# Patient Record
Sex: Male | Born: 1965
Health system: Southern US, Community
[De-identification: ages and names within clinical notes are randomized; demographics above are authoritative.]

## PROBLEM LIST (undated history)

## (undated) DIAGNOSIS — G4733 Obstructive sleep apnea (adult) (pediatric): Secondary | ICD-10-CM

## (undated) DIAGNOSIS — I10 Essential (primary) hypertension: Secondary | ICD-10-CM

## (undated) DIAGNOSIS — A071 Giardiasis [lambliasis]: Secondary | ICD-10-CM

## (undated) DIAGNOSIS — R197 Diarrhea, unspecified: Secondary | ICD-10-CM

## (undated) DIAGNOSIS — E785 Hyperlipidemia, unspecified: Secondary | ICD-10-CM

## (undated) HISTORY — DX: Essential (primary) hypertension: I10

## (undated) HISTORY — DX: Hyperlipidemia, unspecified: E78.5

## (undated) HISTORY — DX: Obstructive sleep apnea (adult) (pediatric): G47.33

## (undated) HISTORY — DX: Diarrhea, unspecified: R19.7

## (undated) HISTORY — DX: Giardiasis (lambliasis): A07.1

---

## 1996-04-07 HISTORY — PX: OTHER SURGICAL HISTORY: SHX169

## 1998-08-06 ENCOUNTER — Encounter: Admission: RE | Admit: 1998-08-06 | Discharge: 1998-08-06 | Payer: Self-pay | Admitting: Internal Medicine

## 1998-08-08 ENCOUNTER — Encounter: Admission: RE | Admit: 1998-08-08 | Discharge: 1998-08-08 | Payer: Self-pay | Admitting: Internal Medicine

## 1999-08-30 ENCOUNTER — Encounter: Admission: RE | Admit: 1999-08-30 | Discharge: 1999-08-30 | Payer: Self-pay | Admitting: Internal Medicine

## 1999-09-02 ENCOUNTER — Encounter: Admission: RE | Admit: 1999-09-02 | Discharge: 1999-09-02 | Payer: Self-pay | Admitting: Hematology and Oncology

## 1999-09-25 ENCOUNTER — Encounter: Admission: RE | Admit: 1999-09-25 | Discharge: 1999-09-25 | Payer: Self-pay | Admitting: Internal Medicine

## 1999-10-04 ENCOUNTER — Emergency Department (HOSPITAL_COMMUNITY): Admission: EM | Admit: 1999-10-04 | Discharge: 1999-10-04 | Payer: Self-pay | Admitting: Emergency Medicine

## 2000-02-13 ENCOUNTER — Encounter: Admission: RE | Admit: 2000-02-13 | Discharge: 2000-02-13 | Payer: Self-pay | Admitting: Hematology and Oncology

## 2000-02-20 ENCOUNTER — Encounter: Admission: RE | Admit: 2000-02-20 | Discharge: 2000-05-20 | Payer: Self-pay | Admitting: Internal Medicine

## 2001-12-21 ENCOUNTER — Encounter: Admission: RE | Admit: 2001-12-21 | Discharge: 2001-12-21 | Payer: Self-pay | Admitting: Internal Medicine

## 2001-12-27 ENCOUNTER — Encounter: Admission: RE | Admit: 2001-12-27 | Discharge: 2001-12-27 | Payer: Self-pay | Admitting: Internal Medicine

## 2002-11-29 ENCOUNTER — Encounter: Admission: RE | Admit: 2002-11-29 | Discharge: 2002-11-29 | Payer: Self-pay | Admitting: Internal Medicine

## 2003-08-16 ENCOUNTER — Ambulatory Visit (HOSPITAL_COMMUNITY): Admission: RE | Admit: 2003-08-16 | Discharge: 2003-08-16 | Payer: Self-pay | Admitting: Orthopaedic Surgery

## 2004-02-27 ENCOUNTER — Ambulatory Visit: Payer: Self-pay | Admitting: Internal Medicine

## 2004-02-28 ENCOUNTER — Ambulatory Visit: Payer: Self-pay | Admitting: Internal Medicine

## 2005-04-16 ENCOUNTER — Ambulatory Visit (HOSPITAL_COMMUNITY): Admission: RE | Admit: 2005-04-16 | Discharge: 2005-04-16 | Payer: Self-pay | Admitting: Internal Medicine

## 2005-04-16 ENCOUNTER — Ambulatory Visit: Payer: Self-pay | Admitting: Internal Medicine

## 2005-11-05 ENCOUNTER — Ambulatory Visit: Payer: Self-pay | Admitting: Internal Medicine

## 2005-12-19 LAB — HM COLONOSCOPY: HM Colonoscopy: NEGATIVE

## 2005-12-25 ENCOUNTER — Encounter: Payer: Self-pay | Admitting: Internal Medicine

## 2006-02-05 ENCOUNTER — Ambulatory Visit: Admission: RE | Admit: 2006-02-05 | Discharge: 2006-02-05 | Payer: Self-pay | Admitting: Gastroenterology

## 2006-02-05 ENCOUNTER — Encounter: Payer: Self-pay | Admitting: Internal Medicine

## 2006-04-02 DIAGNOSIS — I10 Essential (primary) hypertension: Secondary | ICD-10-CM | POA: Insufficient documentation

## 2006-04-02 DIAGNOSIS — E785 Hyperlipidemia, unspecified: Secondary | ICD-10-CM | POA: Insufficient documentation

## 2006-04-02 DIAGNOSIS — A071 Giardiasis [lambliasis]: Secondary | ICD-10-CM | POA: Insufficient documentation

## 2006-04-02 DIAGNOSIS — R0681 Apnea, not elsewhere classified: Secondary | ICD-10-CM | POA: Insufficient documentation

## 2006-04-02 DIAGNOSIS — R197 Diarrhea, unspecified: Secondary | ICD-10-CM | POA: Insufficient documentation

## 2006-10-29 ENCOUNTER — Encounter (INDEPENDENT_AMBULATORY_CARE_PROVIDER_SITE_OTHER): Payer: Self-pay | Admitting: Internal Medicine

## 2006-10-29 ENCOUNTER — Ambulatory Visit: Payer: Self-pay | Admitting: Internal Medicine

## 2006-10-29 DIAGNOSIS — F331 Major depressive disorder, recurrent, moderate: Secondary | ICD-10-CM | POA: Insufficient documentation

## 2006-10-29 DIAGNOSIS — J45909 Unspecified asthma, uncomplicated: Secondary | ICD-10-CM | POA: Insufficient documentation

## 2006-10-29 DIAGNOSIS — E781 Pure hyperglyceridemia: Secondary | ICD-10-CM | POA: Insufficient documentation

## 2006-10-30 LAB — CONVERTED CEMR LAB
ALT: 30 units/L (ref 0–53)
BUN: 16 mg/dL (ref 6–23)
CO2: 26 meq/L (ref 19–32)
Calcium: 9.6 mg/dL (ref 8.4–10.5)
Chloride: 103 meq/L (ref 96–112)
Creatinine, Ser: 1 mg/dL (ref 0.40–1.50)
Glucose, Bld: 91 mg/dL (ref 70–99)
LDL Cholesterol: 104 mg/dL — ABNORMAL HIGH (ref 0–99)
Sodium: 141 meq/L (ref 135–145)
Total Bilirubin: 0.7 mg/dL (ref 0.3–1.2)

## 2006-12-09 ENCOUNTER — Ambulatory Visit: Payer: Self-pay | Admitting: Internal Medicine

## 2006-12-15 ENCOUNTER — Telehealth: Payer: Self-pay | Admitting: *Deleted

## 2007-04-19 ENCOUNTER — Telehealth: Payer: Self-pay | Admitting: *Deleted

## 2007-05-28 ENCOUNTER — Emergency Department (HOSPITAL_COMMUNITY): Admission: EM | Admit: 2007-05-28 | Discharge: 2007-05-28 | Payer: Self-pay | Admitting: Family Medicine

## 2008-08-22 ENCOUNTER — Ambulatory Visit: Payer: Self-pay | Admitting: Family Medicine

## 2008-10-04 ENCOUNTER — Ambulatory Visit: Payer: Self-pay | Admitting: Family Medicine

## 2008-10-04 DIAGNOSIS — R42 Dizziness and giddiness: Secondary | ICD-10-CM | POA: Insufficient documentation

## 2008-10-06 LAB — CONVERTED CEMR LAB
Calcium: 9.3 mg/dL (ref 8.4–10.5)
Eosinophils Absolute: 0.1 10*3/uL (ref 0.0–0.7)
Eosinophils Relative: 2.5 % (ref 0.0–5.0)
Glucose, Bld: 89 mg/dL (ref 70–99)
HCT: 44.1 % (ref 39.0–52.0)
Lymphocytes Relative: 28.2 % (ref 12.0–46.0)
Lymphs Abs: 1.6 10*3/uL (ref 0.7–4.0)
MCV: 94.4 fL (ref 78.0–100.0)
Monocytes Absolute: 0.6 10*3/uL (ref 0.1–1.0)
Neutro Abs: 3.5 10*3/uL (ref 1.4–7.7)
Potassium: 4 meq/L (ref 3.5–5.1)
RBC: 4.67 M/uL (ref 4.22–5.81)
Sodium: 140 meq/L (ref 135–145)

## 2008-12-01 ENCOUNTER — Ambulatory Visit: Payer: Self-pay | Admitting: Family Medicine

## 2009-04-20 ENCOUNTER — Ambulatory Visit: Payer: Self-pay | Admitting: Family Medicine

## 2009-04-20 DIAGNOSIS — M25519 Pain in unspecified shoulder: Secondary | ICD-10-CM | POA: Insufficient documentation

## 2009-09-14 ENCOUNTER — Ambulatory Visit: Payer: Self-pay | Admitting: Family Medicine

## 2009-09-14 DIAGNOSIS — H6690 Otitis media, unspecified, unspecified ear: Secondary | ICD-10-CM | POA: Insufficient documentation

## 2010-05-09 NOTE — Assessment & Plan Note (Signed)
Summary: ear pain/njr   Vital Signs:  Patient profile:   45 year old male Temp:     98.7 degrees F oral BP sitting:   110 / 84  (left arm) Cuff size:   regular  Vitals Entered By: Sid Falcon LPN (September 14, 2009 10:02 AM) CC: Right ear pain   History of Present Illness: Sharp R ear pain for 2 days.  No drainage. Multiple tubes in past.  Denies sore throat, nasal congestion, fever, chills. Mild tinnitus.  No major hearing changes.  No vertigo.  Allergies (verified): No Known Drug Allergies  Past History:  Past Medical History: Last updated: 04/02/2006 Asthma Hyperlipidemia Hypertension Obstructive apnea (sleep study 11-20-95, no apneas), ?obstructive hypopneas? Giardiasis, hx of, resolved Diarrhea Family hx of colon cancer, father died at age 54  Past Surgical History: Last updated: 04/02/2006 ENT surgery, 1998 PMH reviewed for relevance  Review of Systems      See HPI  Physical Exam  General:  Well-developed,well-nourished,in no acute distress; alert,appropriate and cooperative throughout examination Ears:  R serous effusion but no erythema or bulging.  L TM scarring otherwise normal. Nose:  External nasal examination shows no deformity or inflammation. Nasal mucosa are pink and moist without lesions or exudates. Mouth:  Oral mucosa and oropharynx without lesions or exudates.  Teeth in good repair. Neck:  No deformities, masses, or tenderness noted. Lungs:  Normal respiratory effort, chest expands symmetrically. Lungs are clear to auscultation, no crackles or wheezes. Heart:  Normal rate and regular rhythm. S1 and S2 normal without gallop, murmur, click, rub or other extra sounds.   Impression & Recommendations:  Problem # 1:  OTITIS MEDIA (ICD-382.9)  His updated medication list for this problem includes:    Motrin Ib 200 Mg Tabs (Ibuprofen) .Marland Kitchen... Take 3 pills once daily with food.    Amoxicillin-pot Clavulanate 875-125 Mg Tabs (Amoxicillin-pot  clavulanate) ..... One by mouth two times a day with food for 10 days  Complete Medication List: 1)  Prinivil 10 Mg Tabs (Lisinopril) .... Take 1/2  tablet by mouth once a day 2)  Flonase 50 Mcg/act Susp (Fluticasone propionate) .... Use 2 sprays in each nostril once daily as needed 3)  Proventil 90 Mcg/act Aers (Albuterol) .... Inhale 1 puff as needed for wheezing or shortness of breath 4)  Prilosec 20 Mg Cpdr (Omeprazole) .... Take 1 capsule by mouth once a day 5)  Motrin Ib 200 Mg Tabs (Ibuprofen) .... Take 3 pills once daily with food. 6)  Cyclobenzaprine Hcl 5 Mg Tabs (Cyclobenzaprine hcl) .... One by mouth q 8 hours as needed muscle spasm 7)  Amoxicillin-pot Clavulanate 875-125 Mg Tabs (Amoxicillin-pot clavulanate) .... One by mouth two times a day with food for 10 days  Patient Instructions: 1)  Be in touch in 2 weeks if symptoms not improving. Prescriptions: AMOXICILLIN-POT CLAVULANATE 875-125 MG TABS (AMOXICILLIN-POT CLAVULANATE) one by mouth two times a day with food for 10 days  #20 x 0   Entered and Authorized by:   Evelena Peat MD   Signed by:   Evelena Peat MD on 09/14/2009   Method used:   Electronically to        [facility]  [place] [address] (retail)       [address]       [place], [place]  [address]       Ph: [phone] or [phone]       Fax: [phone]   RxID:   [id number]

## 2010-05-09 NOTE — Assessment & Plan Note (Signed)
Summary: L SHOULDER PAIN // RS   Vital Signs:  Patient profile:   45 year old male Temp:     98.7 degrees F oral BP sitting:   110 / 82  (left arm) Cuff size:   regular  Vitals Entered By: Sid Falcon LPN (April 20, 2009 1:47 PM) CC: Left shoulder pain, chronic   History of Present Illness: Persistent left shoulder pain. Initial injury several months ago.  injury occurred while playing hockey. He remembers being hit from the back side. No history of shoulder dislocation. Achy pain deep in the left shoulder. Worse with internal rotation and somewhat with abduction. No definite weakness.  No signif night pain.  Some pain reaching overhead. Anti-inflammatories without relief. No prior surgery.  Allergies (verified): No Known Drug Allergies  Past History:  Past Medical History: Last updated: 04/02/2006 Asthma Hyperlipidemia Hypertension Obstructive apnea (sleep study 11-20-95, no apneas), ?obstructive hypopneas? Giardiasis, hx of, resolved Diarrhea Family hx of colon cancer, father died at age 63  Review of Systems      See HPI  Physical Exam  General:  Well-developed,well-nourished,in no acute distress; alert,appropriate and cooperative throughout examination Extremities:  shoulders symmetric in appearance. No a.c. joint tenderness. No localizing tenderness. Full range of motion. Pain with internal rotation and minimal with abduction left shoulder.   Impression & Recommendations:  Problem # 1:  SHOULDER PAIN (ICD-719.41)  Probable rotator cuff etiology. Cannot rule out small tear versus tendinitis. Risks and benefits of steroid injection discussed. We'll try a steroid injection and if no better by next week consider MRI  to further evaluate. His updated medication list for this problem includes:    Motrin Ib 200 Mg Tabs (Ibuprofen) .Marland Kitchen... Take 3 pills once daily with food.    Cyclobenzaprine Hcl 5 Mg Tabs (Cyclobenzaprine hcl) ..... One by mouth q 8 hours as needed  muscle spasm  Orders: Joint Aspirate / Injection, Large (20610) Depo- Medrol 40mg  (J1030)  Complete Medication List: 1)  Prinivil 10 Mg Tabs (Lisinopril) .... Take 1/2  tablet by mouth once a day 2)  Flonase 50 Mcg/act Susp (Fluticasone propionate) .... Use 2 sprays in each nostril once daily as needed 3)  Proventil 90 Mcg/act Aers (Albuterol) .... Inhale 1 puff as needed for wheezing or shortness of breath 4)  Prilosec 20 Mg Cpdr (Omeprazole) .... Take 1 capsule by mouth once a day 5)  Motrin Ib 200 Mg Tabs (Ibuprofen) .... Take 3 pills once daily with food. 6)  Cyclobenzaprine Hcl 5 Mg Tabs (Cyclobenzaprine hcl) .... One by mouth q 8 hours as needed muscle spasm  Patient Instructions: 1)  Be in touch by next week if not seeing substantial improvement left shoulder pain.

## 2010-05-27 ENCOUNTER — Ambulatory Visit: Payer: Self-pay | Admitting: Family Medicine

## 2010-06-17 ENCOUNTER — Ambulatory Visit (INDEPENDENT_AMBULATORY_CARE_PROVIDER_SITE_OTHER): Payer: 59 | Admitting: Family Medicine

## 2010-06-17 ENCOUNTER — Encounter: Payer: Self-pay | Admitting: Family Medicine

## 2010-06-17 DIAGNOSIS — M546 Pain in thoracic spine: Secondary | ICD-10-CM

## 2010-06-17 DIAGNOSIS — M549 Dorsalgia, unspecified: Secondary | ICD-10-CM

## 2010-06-17 DIAGNOSIS — J309 Allergic rhinitis, unspecified: Secondary | ICD-10-CM

## 2010-06-17 DIAGNOSIS — J45909 Unspecified asthma, uncomplicated: Secondary | ICD-10-CM

## 2010-06-17 DIAGNOSIS — I10 Essential (primary) hypertension: Secondary | ICD-10-CM

## 2010-06-17 DIAGNOSIS — K219 Gastro-esophageal reflux disease without esophagitis: Secondary | ICD-10-CM

## 2010-06-17 MED ORDER — LISINOPRIL 10 MG PO TABS: 10.0000 mg | ORAL_TABLET | Freq: Every day | ORAL | Status: DC

## 2010-06-17 MED ORDER — METHOCARBAMOL 750 MG PO TABS: 750.0000 mg | ORAL_TABLET | Freq: Four times a day (QID) | ORAL | Status: AC | PRN

## 2010-06-17 MED ORDER — FLUTICASONE PROPIONATE 50 MCG/ACT NA SUSP: 2.0000 | Freq: Every day | NASAL | Status: DC | PRN

## 2010-06-17 MED ORDER — OMEPRAZOLE 20 MG PO CPDR: 20.0000 mg | DELAYED_RELEASE_CAPSULE | Freq: Every day | ORAL | Status: DC

## 2010-06-17 NOTE — Progress Notes (Signed)
  Subjective:    Patient ID: [name], male    DOB: July 22, 1965, 45 y.o.   MRN: [id number]  HPI  patient seen for medical followup. He has history of hypertension treated with lisinopril. Blood pressure stable around 120/80. No regular exercise. Denies any headaches or dizziness. No cough related to lisinopril.  History of GERD controlled with Prilosec. Needs refills. No dysphagia.  History of mild intermittent asthma takes albuterol for that. In frequent flareups. Seasonal allergies controlled with Flonase. Needs refills.   History of intermittent upper back pain. 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 Has used Robaxin with good relief and requests refills.  no upper extremity weakness.   Review of Systems  Constitutional: Negative for fever, activity change, appetite change and fatigue.  Eyes: Negative for visual disturbance.  Respiratory: Negative for cough.   Cardiovascular: Negative for chest pain and leg swelling.  Gastrointestinal: Negative for abdominal pain.  Genitourinary: Negative for dysuria.  Musculoskeletal: Positive for back pain. Negative for myalgias and joint swelling.  Skin: Negative for rash.  Hematological: Negative for adenopathy.       Objective:   Physical Exam  patient is alert and in no distress  eardrums reveal left normal. Mild cerumen in the right with tympanostomy tube in place Oropharynx is moist and clear Neck supple no adenopathy Chest good auscultation Heart regular rhythm and rate with no murmur Extremities no edema        Assessment & Plan:   #1 hypertension stable refill medication for one year #2 GERD stable refill medication #3 seasonal allergies refill Flonase #4 upper back pain. Prescription for Robaxin 750 mg when necessary

## 2010-07-02 ENCOUNTER — Telehealth: Payer: Self-pay | Admitting: *Deleted

## 2010-07-02 NOTE — Telephone Encounter (Signed)
[name], [facility], called to request additional information re: medical indication for him to not be fit tested Hand written note was difficult to read Fax additional info to [phone]

## 2010-07-04 NOTE — Telephone Encounter (Signed)
We need to contact patient to get his specific feedback on why he cannot wear gear for fit testing.  I know he has hx of asthma and I think it was on that basis.
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
## 2010-07-08 ENCOUNTER — Encounter: Payer: Self-pay | Admitting: Family Medicine

## 2010-07-08 NOTE — Telephone Encounter (Signed)
I have re-written note for patient.

## 2010-07-08 NOTE — Telephone Encounter (Signed)
Rx faxed, confirmation received, will scan original Rx to pt chart for documentation.

## 2010-08-01 ENCOUNTER — Encounter: Payer: Self-pay | Admitting: Family Medicine

## 2010-08-01 ENCOUNTER — Ambulatory Visit (INDEPENDENT_AMBULATORY_CARE_PROVIDER_SITE_OTHER): Payer: 59 | Admitting: Family Medicine

## 2010-08-01 VITALS — BP 138/90 | HR 88 | Temp 98.7°F | Ht 71.0 in | Wt 239.0 lb

## 2010-08-01 DIAGNOSIS — J45909 Unspecified asthma, uncomplicated: Secondary | ICD-10-CM

## 2010-08-01 MED ORDER — PREDNISONE 10 MG PO TABS: ORAL_TABLET | ORAL | Status: AC

## 2010-08-01 MED ORDER — DOXYCYCLINE HYCLATE 100 MG PO CAPS: 100.0000 mg | ORAL_CAPSULE | Freq: Two times a day (BID) | ORAL | Status: AC

## 2010-08-01 NOTE — Patient Instructions (Signed)
Follow up promptly for any worsening shortness of breath or not improving over the next week.

## 2010-08-01 NOTE — Progress Notes (Signed)
  Subjective:    Patient ID: [name], male    DOB: June 06, 1965, 45 y.o.   MRN: [id number]  HPI Patient seen with asthma exacerbation. Start about 4-5 days ago. Increased cough productive of yellow sputum. Increased wheezing. Using albuterol 4-6 times per day. Some night sweats but no definite fever. Using Motrin with some relief. Denies sore throat. No nausea or vomiting. No diarrhea. Nonsmoker.   Review of Systems  Constitutional: Positive for chills and diaphoresis. Negative for fever.  HENT: Negative for ear pain, sore throat, trouble swallowing, voice change and sinus pressure.   Respiratory: Positive for cough, shortness of breath and wheezing.   Cardiovascular: Negative for chest pain, palpitations and leg swelling.       Objective:   Physical Exam  Constitutional: He appears well-developed and well-nourished.  HENT:  Right Ear: External ear normal.  Left Ear: External ear normal.  Mouth/Throat: Oropharynx is clear and moist. No oropharyngeal exudate.  Neck: Neck supple.  Cardiovascular: Normal rate, regular rhythm and normal heart sounds.   No murmur heard. Pulmonary/Chest: Effort normal. No respiratory distress. He has wheezes. He has no rales.  Lymphadenopathy:    He has no cervical adenopathy.          Assessment & Plan:  Asthma exacerbation. Continue as needed use of Proventil. Prednisone taper starting 40 mg daily over 7 days. Doxycycline 100 mg twice a day for 10 days.

## 2010-08-23 NOTE — Op Note (Signed)
[name], [name]            ACCOUNT NO.:  [id number]   MEDICAL RECORD NO.:  [id number]          PATIENT TYPE:  OUT   LOCATION:  [facility]                         FACILITY:  [facility]   PHYSICIAN:  Anselmo Rod, M.D.  DATE OF BIRTH:  1965-08-14   DATE OF PROCEDURE:  02/05/2006  DATE OF DISCHARGE:                                 OPERATIVE REPORT   PROCEDURE:  Screening colonoscopy.   ENDOSCOPIST:  Anselmo Rod, M.D.   INSTRUMENT USED:  Olympus video colonoscope.   INDICATIONS FOR PROCEDURE:  A 45 year old white male with family history of  colon cancer in his father undergoing screening colonoscopy to rule out  colonic polyps, masses, etc.   PREPROCEDURE PREPARATION:  Informed consent was procured from the patient.  The patient was fasted for 4 hours prior to the procedure and prepped with  20 OsmoPrep pills the night of and 12 OsmoPrep pills the morning of the  procedure.  The risks and benefits of the procedure, including a 10% missed  rate of cancer and polyps, were discussed with the patient as well.   PREPROCEDURE PHYSICAL EXAMINATION:  VITAL SIGNS:  Stable.  NECK:  Supple.  CHEST:  Clear to auscultation.  S1 and S2 regular.  ABDOMEN:  Soft with normal bowel sounds.   DESCRIPTION OF PROCEDURE:  The patient was placed in the left lateral  decubitus position and sedated with 450 mg of Diprivan given intravenously  in slow incremental doses.  Once the patient was adequately sedated and  maintained on low-flow oxygen and continuous cardiac monitoring, the Olympus  video colonoscope was advanced from the rectum to the cecum.  The  appendiceal orifice and ileocecal valve were clearly visualized and  photographed.  Small internal hemorrhoids were seen on retroflexion.  No  masses, polyps, erosions, ulcerations, or diverticula were appreciated.  The  patient tolerated the procedure well without immediate complications.   IMPRESSION:  Normal colonoscopy up to the terminal  ileum, except for small  nonbleeding internal hemorrhoids.  No masses, polyps, or diverticula seen.   RECOMMENDATIONS:  1. Continue a high fiber diet with liberal fluid intake.  2. Repeat colonoscopy in the next 5 years unless the patient develops any      abnormal symptoms in the interim, in which case she is to contact the      office immediately.  3. Outpatient follow up as need arises in the future.      Anselmo Rod, M.D.  Electronically Signed     JNM/MEDQ  D:  02/05/2006  T:  02/05/2006  Job:  [id number]   cc:   Ileana Roup, M.D.  Fax: [phone]

## 2011-02-04 ENCOUNTER — Other Ambulatory Visit: Payer: Self-pay | Admitting: *Deleted

## 2011-02-04 DIAGNOSIS — K219 Gastro-esophageal reflux disease without esophagitis: Secondary | ICD-10-CM

## 2011-02-04 MED ORDER — OMEPRAZOLE 20 MG PO CPDR: 20.0000 mg | DELAYED_RELEASE_CAPSULE | Freq: Every day | ORAL | Status: DC

## 2011-05-16 ENCOUNTER — Encounter: Payer: Self-pay | Admitting: Family Medicine

## 2011-05-16 ENCOUNTER — Ambulatory Visit (INDEPENDENT_AMBULATORY_CARE_PROVIDER_SITE_OTHER): Payer: 59 | Admitting: Family Medicine

## 2011-05-16 VITALS — BP 118/88 | Temp 98.4°F | Wt 250.0 lb

## 2011-05-16 DIAGNOSIS — K219 Gastro-esophageal reflux disease without esophagitis: Secondary | ICD-10-CM

## 2011-05-16 DIAGNOSIS — J329 Chronic sinusitis, unspecified: Secondary | ICD-10-CM

## 2011-05-16 MED ORDER — OMEPRAZOLE 20 MG PO CPDR: 20.0000 mg | DELAYED_RELEASE_CAPSULE | Freq: Every day | ORAL | Status: DC

## 2011-05-16 MED ORDER — LEVOFLOXACIN 500 MG PO TABS: 500.0000 mg | ORAL_TABLET | Freq: Every day | ORAL | Status: AC

## 2011-05-16 NOTE — Progress Notes (Signed)
  Subjective:    Patient ID: [name], male    DOB: 10/04/1965, 46 y.o.   MRN: [id number]  HPI  Progressive sinus problems over the past week. Started with typical cold-like symptoms. Now has progressive frontal sinus pain. No fever but some chills and sweats. Diffuse bodyaches. History of previous sinus surgery several years ago. He had some colored nasal discharge. Intermittent sore throat. Rare cough. Has tried over-the-counter medications as well as Flonase and Afrin without much improvement.   Review of Systems As per history of present illness    Objective:   Physical Exam  Constitutional: He appears well-developed and well-nourished.  HENT:  Right Ear: External ear normal.  Left Ear: External ear normal.  Mouth/Throat: Oropharynx is clear and moist.       Erythematous nasal mucosa  Neck: Neck supple.  Cardiovascular: Normal rate and regular rhythm.   Pulmonary/Chest: Effort normal and breath sounds normal. No respiratory distress. He has no wheezes. He has no rales.  Lymphadenopathy:    He has no cervical adenopathy.          Assessment & Plan:  Frontal sinusitis. Start Levaquin 500 milligrams once daily for 10 days. Adequate hydration. Followup as needed

## 2011-05-16 NOTE — Patient Instructions (Signed)

## 2011-09-22 ENCOUNTER — Ambulatory Visit (INDEPENDENT_AMBULATORY_CARE_PROVIDER_SITE_OTHER): Payer: 59 | Admitting: Family Medicine

## 2011-09-22 ENCOUNTER — Encounter: Payer: Self-pay | Admitting: Family Medicine

## 2011-09-22 VITALS — BP 130/90 | Temp 98.4°F | Wt 258.0 lb

## 2011-09-22 DIAGNOSIS — I10 Essential (primary) hypertension: Secondary | ICD-10-CM

## 2011-09-22 DIAGNOSIS — L259 Unspecified contact dermatitis, unspecified cause: Secondary | ICD-10-CM

## 2011-09-22 MED ORDER — LISINOPRIL 10 MG PO TABS: 10.0000 mg | ORAL_TABLET | Freq: Every day | ORAL | Status: DC

## 2011-09-22 MED ORDER — FLUTICASONE PROPIONATE 50 MCG/ACT NA SUSP: 2.0000 | Freq: Every day | NASAL | Status: DC | PRN

## 2011-09-22 MED ORDER — PREDNISONE 10 MG PO TABS: ORAL_TABLET | ORAL | Status: DC

## 2011-09-22 NOTE — Patient Instructions (Addendum)

## 2011-09-22 NOTE — Progress Notes (Signed)
  Subjective:    Patient ID: [name], male    DOB: 06-30-1965, 46 y.o.   MRN: [id number]  HPI  Patient seen with pruritic vesicular rash upper extremities and to a lesser extent lower extremities. Onset about one week ago. Has recently had some involvement of both palms. No alleviating factors. Denies any fever or chills. Symptoms are moderate. Has taken prednisone for similar flareup previously. No relief with topicals.   Review of Systems  Constitutional: Negative for fever and chills.  Respiratory: Negative for cough and shortness of breath.   Skin: Positive for rash.       Objective:   Physical Exam  Constitutional: He appears well-developed and well-nourished. No distress.  Cardiovascular: Normal rate and regular rhythm.   Pulmonary/Chest: Effort normal and breath sounds normal. No respiratory distress. He has no wheezes. He has no rales.  Skin:       Patient has rash scattered mostly forearms bilaterally. Several patches of erythematous base and vesicular surface. Nontender.          Assessment & Plan:  Contact dermatitis. Prednisone taper starting at 60 mg daily over 12 days.

## 2012-05-12 ENCOUNTER — Emergency Department
Admission: EM | Admit: 2012-05-12 | Discharge: 2012-05-12 | Disposition: A | Discharge status: Home / Self Care | Payer: 59 | Source: Home / Self Care | Attending: Family Medicine | Admitting: Family Medicine

## 2012-05-12 ENCOUNTER — Emergency Department (INDEPENDENT_AMBULATORY_CARE_PROVIDER_SITE_OTHER): Payer: 59

## 2012-05-12 ENCOUNTER — Encounter: Payer: Self-pay | Admitting: Emergency Medicine

## 2012-05-12 DIAGNOSIS — M7989 Other specified soft tissue disorders: Secondary | ICD-10-CM

## 2012-05-12 DIAGNOSIS — M79609 Pain in unspecified limb: Secondary | ICD-10-CM

## 2012-05-12 DIAGNOSIS — M79675 Pain in left toe(s): Secondary | ICD-10-CM

## 2012-05-12 LAB — POCT CBC W AUTO DIFF (K'VILLE URGENT CARE)

## 2012-05-12 MED ORDER — COLCHICINE 0.6 MG PO TABS: ORAL_TABLET | ORAL | Status: DC

## 2012-05-12 MED ORDER — PREDNISONE 20 MG PO TABS: 20.0000 mg | ORAL_TABLET | Freq: Two times a day (BID) | ORAL | Status: DC

## 2012-05-12 NOTE — ED Notes (Signed)
Left great toe pain x 2 days, no trauma

## 2012-05-12 NOTE — ED Provider Notes (Signed)
History     CSN: [id number]  Arrival date & time 05/12/12  1236   First MD Initiated Contact with Patient 05/12/12 1257      Chief Complaint  Patient presents with  . Foot Pain     HPI Comments: Patient complains of onset of pain and redness in left great toe yesterday.  He recalls no injury to the area.  No fevers, chills, and sweats.  No past history of gout or family history of gout.  No recent change in diet.  Patient is a 47 y.o. male presenting with toe pain. The history is provided by the patient.  Toe Pain This is a new problem. The current episode started yesterday. The problem occurs constantly. The problem has been gradually worsening. Associated symptoms comments: none. The symptoms are aggravated by walking and standing. Nothing relieves the symptoms. Treatments tried: Ibuprofen. The treatment provided no relief.    Past Medical History  Diagnosis Date  . Asthma   . Hyperlipidemia   . Hypertension   . Obstructive sleep apnea     sleep study 11/20/95, no apneas, ?obstructive hypopneas?  . Giardiasis     resolved  . Diarrhea     Past Surgical History  Procedure Date  . Ent surgery 1998    Family History  Problem Relation Age of Onset  . Colon cancer Father   . Cancer Father 72    colon    History  Substance Use Topics  . Smoking status: Never Smoker   . Smokeless tobacco: Not on file  . Alcohol Use: Yes      Review of Systems  All other systems reviewed and are negative.    Allergies  Review of patient's allergies indicates not on file.  Home Medications   Current Outpatient Rx  Name  Route  Sig  Dispense  Refill  . ALBUTEROL SULFATE HFA 108 (90 BASE) MCG/ACT IN AERS   Inhalation   Inhale 1 puff into the lungs as needed. For wheezing or shortness of breath          . COLCHICINE 0.6 MG PO TABS      Take two tabs by mouth stat, then one tab one hour later   3 tablet   2   . FLUTICASONE PROPIONATE 50 MCG/ACT NA SUSP   Nasal   Place  2 sprays into the nose daily as needed.   48 g   3   . IBUPROFEN 200 MG PO TABS   Oral   Take 600 mg by mouth daily. With food          . LISINOPRIL 10 MG PO TABS   Oral   Take 1 tablet (10 mg total) by mouth daily.   90 tablet   3   . OMEPRAZOLE 20 MG PO CPDR   Oral   Take 1 capsule (20 mg total) by mouth daily.   90 capsule   3   . PREDNISONE 20 MG PO TABS   Oral   Take 1 tablet (20 mg total) by mouth 2 (two) times daily.   10 tablet   0     BP 147/93  Pulse 83  Temp 98.5 F (36.9 C) (Oral)  Resp 16  Ht 5\' 11"  (1.803 m)  Wt 254 lb (115.214 kg)  BMI 35.43 kg/m2  SpO2 98%  Physical Exam  Nursing note and vitals reviewed. Constitutional: He is oriented to person, place, and time. He appears well-developed and well-nourished. No distress.  Patient is obese (BMI 35.4)  HENT:  Head: Normocephalic.  Eyes: Conjunctivae normal are normal. Pupils are equal, round, and reactive to light.  Musculoskeletal:       Left foot: He exhibits decreased range of motion, tenderness, bony tenderness and swelling. He exhibits normal capillary refill, no crepitus, no deformity and no laceration.       Feet:       There is tenderness, erythema, and warmth over the left first MTP joint extending slightly proximal to the MTP joint.  Decreased range of motion first toe.  Distal Neurovascular function is intact.   Neurological: He is alert and oriented to person, place, and time.  Skin: Skin is warm and dry.    ED Course  Procedures  none   Labs Reviewed  POCT CBC W AUTO DIFF ([facility])  WBC 6.7; LY 25.2; MO 5.9; GR 68.9; Hgb 15.4; Platelets 253   URIC ACID pending   Dg Toe Great Left  05/12/2012  *RADIOLOGY REPORT*  Clinical Data: Pain and swelling.  LEFT GREAT TOE  Comparison: None  Findings: There are degenerative changes at the metatarsal phalangeal joint with joint space narrowing and spurring changes. The interphalangeal joint is maintained.  No acute bony  findings or destructive bony changes.  IMPRESSION:  1.  Degenerative changes at the first metatarsal phalangeal joint. 2.  No acute bony findings or destructive bony changes.   Original Report Authenticated By: Rudie Meyer, M.D.      1. Pain in toe of left foot; suspect acute gout.  Normal white blood count reassuring      MDM  Begin prednisone burst.  Uric acid level pending Increase fluid intake.  May take Tylenol for pain. Discussed dietary precautions, including decrease in sugar and high fructose corn syrup intake. If another episode occurs in near future, given Rx for Colcrys to hold; advised to start immediately at onset of symptoms. Followup with Family Doctor if not improved in about 5 days.        Lattie Haw, MD 05/12/12 [phone]

## 2012-05-13 LAB — URIC ACID: Uric Acid, Serum: 7.6 mg/dL (ref 4.0–7.8)

## 2012-06-02 ENCOUNTER — Encounter: Payer: Self-pay | Admitting: Family Medicine

## 2012-06-02 ENCOUNTER — Ambulatory Visit (INDEPENDENT_AMBULATORY_CARE_PROVIDER_SITE_OTHER): Payer: 59 | Admitting: Family Medicine

## 2012-06-02 VITALS — BP 130/90 | Temp 98.9°F

## 2012-06-02 DIAGNOSIS — M109 Gout, unspecified: Secondary | ICD-10-CM

## 2012-06-02 MED ORDER — PREDNISONE 20 MG PO TABS: 20.0000 mg | ORAL_TABLET | Freq: Two times a day (BID) | ORAL | Status: DC

## 2012-06-02 NOTE — Progress Notes (Signed)
  Subjective:    Patient ID: [name], male    DOB: 29-May-1965, 47 y.o.   MRN: [id number]  HPI Left MTP joint pain. Seen in urgent care a couple weeks ago diagnosed with presumptive gout. Improved with prednisone. Uric acid 7.8. No prior history of gout. No known family history. Denies recent foot injury. No fevers or chills. Prescribed colchicine but has not yet filled prescription. Ambulating without much difficulty. Rates pain 1/10 at this time.   Review of Systems  Constitutional: Negative for fever and chills.  Musculoskeletal: Negative for gait problem.       Objective:   Physical Exam  Constitutional: He appears well-developed and well-nourished.  Cardiovascular: Normal rate and regular rhythm.   Pulmonary/Chest: Effort normal and breath sounds normal. No respiratory distress. He has no wheezes. He has no rales.  Musculoskeletal:  Left foot-minimal swelling and TP joint region. No warmth. Minimal if any erythema. Mostly nontender          Assessment & Plan:  Recent acute left foot pain- MTP joint. Strongly suspect acute gout. We explained that uric acid is frequently reduce during acute flareup. Dietary factors discussed. Refill prednisone if he has recurrence. He also has prescription for colchicine as needed. We discussed possible prophylaxis but at this point he only had one episode and will observe. He does not have any risk factors such as HCTZ.  Recommend schedule complete physical

## 2012-06-02 NOTE — Patient Instructions (Addendum)
Gout Gout is an inflammatory condition (arthritis) caused by a buildup of uric acid crystals in the joints. Uric acid is a chemical that is normally present in the blood. Under some circumstances, uric acid can form into crystals in your joints. This causes joint redness, soreness, and swelling (inflammation). Repeat attacks are common. Over time, uric acid crystals can form into masses (tophi) near a joint, causing disfigurement. Gout is treatable and often preventable. CAUSES  The disease begins with elevated levels of uric acid in the blood. Uric acid is produced by your body when it breaks down a naturally found substance called purines. This also happens when you eat certain foods such as meats and fish. Causes of an elevated uric acid level include:  Being passed down from parent to child (heredity).  Diseases that cause increased uric acid production (obesity, psoriasis, some cancers).  Excessive alcohol use.  Diet, especially diets rich in meat and seafood.  Medicines, including certain cancer-fighting drugs (chemotherapy), diuretics, and aspirin.  Chronic kidney disease. The kidneys are no longer able to remove uric acid well.  Problems with metabolism. Conditions strongly associated with gout include:  Obesity.  High blood pressure.  High cholesterol.  Diabetes. Not everyone with elevated uric acid levels gets gout. It is not understood why some people get gout and others do not. Surgery, joint injury, and eating too much of certain foods are some of the factors that can lead to gout. SYMPTOMS   An attack of gout comes on quickly. It causes intense pain with redness, swelling, and warmth in a joint.  Fever can occur.  Often, only one joint is involved. Certain joints are more commonly involved:  Base of the big toe.  Knee.  Ankle.  Wrist.  Finger. Without treatment, an attack usually goes away in a few days to weeks. Between attacks, you usually will not have  symptoms, which is different from many other forms of arthritis. DIAGNOSIS  Your caregiver will suspect gout based on your symptoms and exam. Removal of fluid from the joint (arthrocentesis) is done to check for uric acid crystals. Your caregiver will give you a medicine that numbs the area (local anesthetic) and use a needle to remove joint fluid for exam. Gout is confirmed when uric acid crystals are seen in joint fluid, using a special microscope. Sometimes, blood, urine, and X-ray tests are also used. TREATMENT  There are 2 phases to gout treatment: treating the sudden onset (acute) attack and preventing attacks (prophylaxis). Treatment of an Acute Attack  Medicines are used. These include anti-inflammatory medicines or steroid medicines.  An injection of steroid medicine into the affected joint is sometimes necessary.  The painful joint is rested. Movement can worsen the arthritis.  You may use warm or cold treatments on painful joints, depending which works best for you.  Discuss the use of coffee, vitamin C, or cherries with your caregiver. These may be helpful treatment options. Treatment to Prevent Attacks After the acute attack subsides, your caregiver may advise prophylactic medicine. These medicines either help your kidneys eliminate uric acid from your body or decrease your uric acid production. You may need to stay on these medicines for a very long time. The early phase of treatment with prophylactic medicine can be associated with an increase in acute gout attacks. For this reason, during the first few months of treatment, your caregiver may also advise you to take medicines usually used for acute gout treatment. Be sure you understand your caregiver's directions.   You should also discuss dietary treatment with your caregiver. Certain foods such as meats and fish can increase uric acid levels. Other foods such as dairy can decrease levels. Your caregiver can give you a list of foods  to avoid. HOME CARE INSTRUCTIONS   Do not take aspirin to relieve pain. This raises uric acid levels.  Only take over-the-counter or prescription medicines for pain, discomfort, or fever as directed by your caregiver.  Rest the joint as much as possible. When in bed, keep sheets and blankets off painful areas.  Keep the affected joint raised (elevated).  Use crutches if the painful joint is in your leg.  Drink enough water and fluids to keep your urine clear or pale yellow. This helps your body get rid of uric acid. Do not drink alcoholic beverages. They slow the passage of uric acid.  Follow your caregiver's dietary instructions. Pay careful attention to the amount of protein you eat. Your daily diet should emphasize fruits, vegetables, whole grains, and fat-free or low-fat milk products.  Maintain a healthy body weight. SEEK MEDICAL CARE IF:   You have an oral temperature above 102 F (38.9 C).  You develop diarrhea, vomiting, or any side effects from medicines.  You do not feel better in 24 hours, or you are getting worse. SEEK IMMEDIATE MEDICAL CARE IF:   Your joint becomes suddenly more tender and you have:  Chills.  An oral temperature above 102 F (38.9 C), not controlled by medicine. MAKE SURE YOU:   Understand these instructions.  Will watch your condition.  Will get help right away if you are not doing well or get worse. Document Released: 03/21/2000 Document Revised: 06/16/2011 Document Reviewed: 07/02/2009 [facility] Patient Information 2013 [place], [place].  Schedule complete physical.

## 2012-06-15 ENCOUNTER — Other Ambulatory Visit: Payer: Self-pay | Admitting: *Deleted

## 2012-06-15 DIAGNOSIS — K219 Gastro-esophageal reflux disease without esophagitis: Secondary | ICD-10-CM

## 2012-06-15 MED ORDER — OMEPRAZOLE 20 MG PO CPDR: 20.0000 mg | DELAYED_RELEASE_CAPSULE | Freq: Every day | ORAL | Status: DC

## 2012-06-22 ENCOUNTER — Other Ambulatory Visit: Payer: 59

## 2012-06-23 ENCOUNTER — Other Ambulatory Visit (INDEPENDENT_AMBULATORY_CARE_PROVIDER_SITE_OTHER): Payer: 59

## 2012-06-23 DIAGNOSIS — Z0000 Encounter for general adult medical examination without abnormal findings: Secondary | ICD-10-CM

## 2012-06-23 LAB — HEPATIC FUNCTION PANEL
ALT: 42 U/L (ref 0–53)
Alkaline Phosphatase: 58 U/L (ref 39–117)
Total Bilirubin: 0.7 mg/dL (ref 0.3–1.2)
Total Protein: 7 g/dL (ref 6.0–8.3)

## 2012-06-23 LAB — POCT URINALYSIS DIPSTICK
Nitrite, UA: NEGATIVE
Spec Grav, UA: 1.025
Urobilinogen, UA: 0.2
pH, UA: 5.5

## 2012-06-23 LAB — CBC WITH DIFFERENTIAL/PLATELET
Basophils Absolute: 0 10*3/uL (ref 0.0–0.1)
Basophils Relative: 0.7 % (ref 0.0–3.0)
MCHC: 34.8 g/dL (ref 30.0–36.0)
Neutro Abs: 2.9 10*3/uL (ref 1.4–7.7)
Neutrophils Relative %: 60.6 % (ref 43.0–77.0)
Platelets: 272 10*3/uL (ref 150.0–400.0)
RDW: 13.2 % (ref 11.5–14.6)
WBC: 4.7 10*3/uL (ref 4.5–10.5)

## 2012-06-23 LAB — BASIC METABOLIC PANEL
Creatinine, Ser: 1 mg/dL (ref 0.4–1.5)
Potassium: 4.4 mEq/L (ref 3.5–5.1)
Sodium: 138 mEq/L (ref 135–145)

## 2012-06-23 LAB — LDL CHOLESTEROL, DIRECT: Direct LDL: 104.5 mg/dL

## 2012-06-23 LAB — LIPID PANEL
HDL: 27.6 mg/dL — ABNORMAL LOW (ref >39.00)
Total CHOL/HDL Ratio: 8
Triglycerides: 346 mg/dL — ABNORMAL HIGH (ref 0.0–149.0)
VLDL: 69.2 mg/dL — ABNORMAL HIGH (ref 0.0–40.0)

## 2012-07-01 ENCOUNTER — Ambulatory Visit (INDEPENDENT_AMBULATORY_CARE_PROVIDER_SITE_OTHER): Payer: 59 | Admitting: Family Medicine

## 2012-07-01 ENCOUNTER — Encounter: Payer: Self-pay | Admitting: Family Medicine

## 2012-07-01 VITALS — BP 120/80 | HR 72 | Temp 98.8°F | Resp 12 | Ht 71.5 in | Wt 257.0 lb

## 2012-07-01 DIAGNOSIS — R7309 Other abnormal glucose: Secondary | ICD-10-CM

## 2012-07-01 DIAGNOSIS — E119 Type 2 diabetes mellitus without complications: Secondary | ICD-10-CM | POA: Insufficient documentation

## 2012-07-01 DIAGNOSIS — Z0000 Encounter for general adult medical examination without abnormal findings: Secondary | ICD-10-CM

## 2012-07-01 DIAGNOSIS — R7303 Prediabetes: Secondary | ICD-10-CM

## 2012-07-01 DIAGNOSIS — E8881 Metabolic syndrome: Secondary | ICD-10-CM | POA: Insufficient documentation

## 2012-07-01 MED ORDER — FLUTICASONE PROPIONATE 50 MCG/ACT NA SUSP: 2.0000 | Freq: Every day | NASAL | Status: DC

## 2012-07-01 NOTE — Progress Notes (Signed)
  Subjective:    Patient ID: [name], male    DOB: [birth year], 47 y.o.   MRN: [id number]  HPI Patient here for complete physical. Past history significant for hyperlipidemia, asthma, gout. He's had some gradual weight gain in recent years. He has perennial allergies treated with Flonase. Start more consistent exercise soon. Dairy strong family history of premature CAD. Patient tetanus 2011.  Past Medical History  Diagnosis Date  . Asthma   . Hyperlipidemia   . Hypertension   . Obstructive sleep apnea     sleep study 11/20/95, no apneas, ?obstructive hypopneas?  . Giardiasis     resolved  . Diarrhea    Past Surgical History  Procedure Laterality Date  . Ent surgery  1998    reports that he has never smoked. He does not have any smokeless tobacco history on file. He reports that  drinks alcohol. His drug history is not on file. family history includes Cancer (age of onset: 54) in his father and Colon cancer in his father. No Known Allergies    Review of Systems  Constitutional: Negative for fever, activity change, appetite change, fatigue and unexpected weight change.  HENT: Negative for ear pain, congestion and trouble swallowing.   Eyes: Negative for pain and visual disturbance.  Respiratory: Negative for cough, shortness of breath and wheezing.   Cardiovascular: Negative for chest pain and palpitations.  Gastrointestinal: Negative for nausea, vomiting, abdominal pain, diarrhea, constipation, blood in stool, abdominal distention and rectal pain.  Genitourinary: Negative for dysuria, hematuria and testicular pain.  Musculoskeletal: Negative for joint swelling and arthralgias.  Skin: Negative for rash.  Neurological: Negative for dizziness, syncope and headaches.  Hematological: Negative for adenopathy.  Psychiatric/Behavioral: Negative for confusion and dysphoric mood.       Objective:   Physical Exam  Constitutional: He is oriented to person, place, and  time. He appears well-developed and well-nourished. No distress.  HENT:  Head: Normocephalic and atraumatic.  Right Ear: External ear normal.  Left Ear: External ear normal.  Mouth/Throat: Oropharynx is clear and moist.  Eyes: Conjunctivae and EOM are normal. Pupils are equal, round, and reactive to light.  Neck: Normal range of motion. Neck supple. No thyromegaly present.  Cardiovascular: Normal rate, regular rhythm and normal heart sounds.   No murmur heard. Pulmonary/Chest: No respiratory distress. He has no wheezes. He has no rales.  Abdominal: Soft. Bowel sounds are normal. He exhibits no distension and no mass. There is no tenderness. There is no rebound and no guarding.  Musculoskeletal: He exhibits no edema.  Lymphadenopathy:    He has no cervical adenopathy.  Neurological: He is alert and oriented to person, place, and time. He displays normal reflexes. No cranial nerve deficit.  Skin: No rash noted.  Psychiatric: He has a normal mood and affect.          Assessment & Plan:  Complete physical. Patient had substantial weight gain recently. Prediabetes with a glucose 120 and meets criteria for metabolic syndrome. Discussed ramifications. Strongly encourage regular exercise and weight loss. Consider followup fasting lipids and glucose in 6 months. Patient plans start regular exercise program. Set up exercise tolerance test first. Brother had MI age 58.

## 2012-07-01 NOTE — Patient Instructions (Addendum)
Metabolic Syndrome, Adult Metabolic syndrome descibes a group of risk factors for heart disease and diabetes. This syndrome has other names including Insulin Resistance Syndrome. The more risk factors you have, the higher your risk of having a heart attack, stroke, or developing diabetes. These risk factors include:  High blood sugar.  High blood triglyceride (a fat found in the blood) level.  High blood pressure.  Abdominal obesity (your extra weight is around your waist instead of your hips).  Low levels of high-density lipoprotein, HDL (good blood cholesterol). If you have any three of these risk factors, you have metabolic syndrome. If you have even one of these factors, you should make lifestyle changes to improve your health in order to prevent serious health diseases.  In people with metabolic syndrome, the cells do not respond properly to insulin. This can lead to high levels of glucose in the blood, which can interfere with normal body processes. Eventually, this can cause high blood pressure and higher fat levels in the blood, and inflammation of your blood vessels. The result can be heart disease and stroke.  CAUSES   Eating a diet rich in calories and saturated fat.  Too little physical activity.  Being overweight. Other underlying causes are:  Family history (genetics).  Ethnicity (South Asians are at a higher risk).  Older age (your chances of developing metabolic syndrome are higher as you grow older).  Insulin resistance. SYMPTOMS  By itself, metabolic syndrome has no symptoms. However, you might have symptoms of diabetes (high blood sugar) or high blood pressure, such as:  Increased thirst, urination, and tiredness.  Dizzy spells.  Dull headaches that are unusual for you.  Blurred vision.  Nosebleeds. DIAGNOSIS  Your caregiver may make a diagnosis of metabolic syndrome if you have at least three of these factors:  If you are overweight mostly around the  waist. This means a waistline greater than 40" in men and more than 35" in women. The waistline limits are 31 to 35 inches for women and 37 to 39 inches for men. In those who have certain genetic risk factors, such as having a family history of diabetes or being of Asian descent.  If you have a blood pressure of 130/85 mm Hg or more, or if you are being treated for high blood pressure.  If your blood triglyceride level is 150 mg/dL or more, or you are being treated for high levels of triglyceride.  If the level of HDL in your blood is below 40 mg/dL in men, less than 50 mg/dL in women, or you are receiving treatment for low levels of HDL.  If the level of sugar in your blood is high with fasting blood sugar level of 110 mg/dL or more, or you are under treatment for diabetes. TREATMENT  Your caregiver may have you make lifestyle changes, which may include:  Exercise.  Losing weight.  Maintaining a healthy diet.  Quitting smoking. The lifestyle changes listed above are key in reducing your risk for heart disease and stroke. Medicines may also be prescribed to help your body respond to insulin better and to reduce your blood pressure and blood fat levels. Aspirin may be recommended to reduce risks of heart disease or stroke.  HOME CARE INSTRUCTIONS   Exercise.  Measure your waist at regular intervals just above the hipbones after you have breathed out.  Maintain a healthy diet.  Eat fruits, such as apples, oranges, and pears.  Eat vegetables.  Eat legumes, such as kidney   beans, peas, and lentils.  Eat food rich in soluble fiber, such as whole grain cereal, oatmeal, and oat bran.  Use olive or safflower oils and avoid saturated fats.  Eat nuts.  Limit the amount of salt you eat or add to food.  Limit the amount of alcohol you drink.  Include fish in your diet, if possible.  Stop smoking if you are a smoker.  Maintain regular follow-up appointments.  Follow your  caregiver's advice. SEEK MEDICAL CARE IF:   You feel very tired or fatigued.  You develop excessive thirst.  You pass large quantities of urine.  You are putting on weight around your waist rather than losing weight.  You develop headaches over and over again.  You have off-and-on dizzy spells. SEEK IMMEDIATE MEDICAL CARE IF:   You develop nosebleeds.  You develop sudden blurred vision.  You develop sudden dizzy spells.  You develop chest pains, trouble breathing, or feel an abnormal or irregular heart beat.  You have a fainting episode.  You develop any sudden trouble speaking and/or swallowing.  You develop sudden weakness in one arm and/or one leg. MAKE SURE YOU:   Understand these instructions.  Will watch your condition.  Will get help right away if you are not doing well or get worse. Document Released: 07/01/2007 Document Revised: 06/16/2011 Document Reviewed: 07/01/2007 [facility] Patient Information 2013 [place], [place]. Fat and Cholesterol Control Diet Cholesterol levels in your body are determined significantly by your diet. Cholesterol levels may also be related to heart disease. The following material helps to explain this relationship and discusses what you can do to help keep your heart healthy. Not all cholesterol is bad. Low-density lipoprotein (LDL) cholesterol is the "bad" cholesterol. It may cause fatty deposits to build up inside your arteries. High-density lipoprotein (HDL) cholesterol is "good." It helps to remove the "bad" LDL cholesterol from your blood. Cholesterol is a very important risk factor for heart disease. Other risk factors are high blood pressure, smoking, stress, heredity, and weight. The heart muscle gets its supply of blood through the coronary arteries. If your LDL cholesterol is high and your HDL cholesterol is low, you are at risk for having fatty deposits build up in your coronary arteries. This leaves less room through which blood can  flow. Without sufficient blood and oxygen, the heart muscle cannot function properly and you may feel chest pains (angina pectoris). When a coronary artery closes up entirely, a part of the heart muscle may die causing a heart attack (myocardial infarction). CHECKING CHOLESTEROL When your caregiver sends your blood to a lab to be examined for cholesterol, a complete lipid (fat) profile may be done. With this test, the total amount of cholesterol and levels of LDL and HDL are determined. Triglycerides are a type of fat that circulates in the blood. They can also be used to determine heart disease risk. The list below describes what the numbers should be: Test: Total Cholesterol.  Less than 200 mg/dl. Test: LDL "bad cholesterol."  Less than 100 mg/dl.  Less than 70 mg/dl if you are at very high risk of a heart attack or sudden cardiac death. Test: HDL "good cholesterol."  Greater than 50 mg/dl for women.  Greater than 40 mg/dl for men. Test: Triglycerides.  Less than 150 mg/dl. CONTROLLING CHOLESTEROL WITH DIET Although exercise and lifestyle factors are important, your diet is key. That is because certain foods are known to raise cholesterol and others to lower it. The goal is to balance  foods for their effect on cholesterol and more importantly, to replace saturated and trans fat with other types of fat, such as monounsaturated fat, polyunsaturated fat, and omega-3 fatty acids. On average, a person should consume no more than 15 to 17 g of saturated fat daily. Saturated and trans fats are considered "bad" fats, and they will raise LDL cholesterol. Saturated fats are primarily found in animal products such as meats, butter, and cream. However, that does not mean you need to give up all your favorite foods. Today, there are good tasting, low-fat, low-cholesterol substitutes for most of the things you like to eat. Choose low-fat or nonfat alternatives. Choose round or loin cuts of red meat. These  types of cuts are lowest in fat and cholesterol. Chicken (without the skin), fish, veal, and ground [place] breast are great choices. Eliminate fatty meats, such as hot dogs and salami. Even shellfish have little or no saturated fat. Have a 3 oz (85 g) portion when you eat lean meat, poultry, or fish. Trans fats are also called "partially hydrogenated oils." They are oils that have been scientifically manipulated so that they are solid at room temperature resulting in a longer shelf life and improved taste and texture of foods in which they are added. Trans fats are found in stick margarine, some tub margarines, cookies, crackers, and baked goods.  When baking and cooking, oils are a great substitute for butter. The monounsaturated oils are especially beneficial since it is believed they lower LDL and raise HDL. The oils you should avoid entirely are saturated tropical oils, such as coconut and palm.  Remember to eat a lot from food groups that are naturally free of saturated and trans fat, including fish, fruit, vegetables, beans, grains (barley, rice, couscous, bulgur wheat), and pasta (without cream sauces).  IDENTIFYING FOODS THAT LOWER CHOLESTEROL  Soluble fiber may lower your cholesterol. This type of fiber is found in fruits such as apples, vegetables such as broccoli, potatoes, and carrots, legumes such as beans, peas, and lentils, and grains such as barley. Foods fortified with plant sterols (phytosterol) may also lower cholesterol. You should eat at least 2 g per day of these foods for a cholesterol lowering effect.  Read package labels to identify low-saturated fats, trans fat free, and low-fat foods at the supermarket. Select cheeses that have only 2 to 3 g saturated fat per ounce. Use a heart-healthy tub margarine that is free of trans fats or partially hydrogenated oil. When buying baked goods (cookies, crackers), avoid partially hydrogenated oils. Breads and muffins should be made from whole  grains (whole-wheat or whole oat flour, instead of "flour" or "enriched flour"). Buy non-creamy canned soups with reduced salt and no added fats.  FOOD PREPARATION TECHNIQUES  Never deep-fry. If you must fry, either stir-fry, which uses very little fat, or use non-stick cooking sprays. When possible, broil, bake, or roast meats, and steam vegetables. Instead of putting butter or margarine on vegetables, use lemon and herbs, applesauce, and cinnamon (for squash and sweet potatoes), nonfat yogurt, salsa, and low-fat dressings for salads.  LOW-SATURATED FAT / LOW-FAT FOOD SUBSTITUTES Meats / Saturated Fat (g)  Avoid: Steak, marbled (3 oz/85 g) / 11 g  Choose: Steak, lean (3 oz/85 g) / 4 g  Avoid: Hamburger (3 oz/85 g) / 7 g  Choose: Hamburger, lean (3 oz/85 g) / 5 g  Avoid: Ham (3 oz/85 g) / 6 g  Choose: Ham, lean cut (3 oz/85 g) / 2.4 g  Avoid:  Chicken, with skin, dark meat (3 oz/85 g) / 4 g  Choose: Chicken, skin removed, dark meat (3 oz/85 g) / 2 g  Avoid: Chicken, with skin, light meat (3 oz/85 g) / 2.5 g  Choose: Chicken, skin removed, light meat (3 oz/85 g) / 1 g Dairy / Saturated Fat (g)  Avoid: Whole milk (1 cup) / 5 g  Choose: Low-fat milk, 2% (1 cup) / 3 g  Choose: Low-fat milk, 1% (1 cup) / 1.5 g  Choose: Skim milk (1 cup) / 0.3 g  Avoid: Hard cheese (1 oz/28 g) / 6 g  Choose: Skim milk cheese (1 oz/28 g) / 2 to 3 g  Avoid: Cottage cheese, 4% fat (1 cup) / 6.5 g  Choose: Low-fat cottage cheese, 1% fat (1 cup) / 1.5 g  Avoid: Ice cream (1 cup) / 9 g  Choose: Sherbet (1 cup) / 2.5 g  Choose: Nonfat frozen yogurt (1 cup) / 0.3 g  Choose: Frozen fruit bar / trace  Avoid: Whipped cream (1 tbs) / 3.5 g  Choose: Nondairy whipped topping (1 tbs) / 1 g Condiments / Saturated Fat (g)  Avoid: Mayonnaise (1 tbs) / 2 g  Choose: Low-fat mayonnaise (1 tbs) / 1 g  Avoid: Butter (1 tbs) / 7 g  Choose: Extra light margarine (1 tbs) / 1 g  Avoid: Coconut oil (1  tbs) / 11.8 g  Choose: Olive oil (1 tbs) / 1.8 g  Choose: Corn oil (1 tbs) / 1.7 g  Choose: Safflower oil (1 tbs) / 1.2 g  Choose: Sunflower oil (1 tbs) / 1.4 g  Choose: Soybean oil (1 tbs) / 2.4 g  Choose: Canola oil (1 tbs) / 1 g Document Released: 03/24/2005 Document Revised: 06/16/2011 Document Reviewed: 09/12/2010 [facility] Patient Information 2013 [place], [place]. Hyperglycemia Hyperglycemia occurs when the glucose (sugar) in your blood is too high. Hyperglycemia can happen for many reasons, but it most often happens to people who do not know they have diabetes or are not managing their diabetes properly.  CAUSES  Whether you have diabetes or not, there are other causes of hyperglycemia. Hyperglycemia can occur when you have diabetes, but it can also occur in other situations that you might not be as aware of, such as: Diabetes  If you have diabetes and are having problems controlling your blood glucose, hyperglycemia could occur because of some of the following reasons:  Not following your meal plan.  Not taking your diabetes medications or not taking it properly.  Exercising less or doing less activity than you normally do.  Being sick. Pre-diabetes  This cannot be ignored. Before people develop Type 2 diabetes, they almost always have "pre-diabetes." This is when your blood glucose levels are higher than normal, but not yet high enough to be diagnosed as diabetes. Research has shown that some long-term damage to the body, especially the heart and circulatory system, may already be occurring during pre-diabetes. If you take action to manage your blood glucose when you have pre-diabetes, you may delay or prevent Type 2 diabetes from developing. Stress  If you have diabetes, you may be "diet" controlled or on oral medications or insulin to control your diabetes. However, you may find that your blood glucose is higher than usual in the hospital whether you have diabetes or not.  This is often referred to as "stress hyperglycemia." Stress can elevate your blood glucose. This happens because of hormones put out by the body during times of stress.  If stress has been the cause of your high blood glucose, it can be followed regularly by your caregiver. That way he/she can make sure your hyperglycemia does not continue to get worse or progress to diabetes. Steroids  Steroids are medications that act on the infection fighting system (immune system) to block inflammation or infection. One side effect can be a rise in blood glucose. Most people can produce enough extra insulin to allow for this rise, but for those who cannot, steroids make blood glucose levels go even higher. It is not unusual for steroid treatments to "uncover" diabetes that is developing. It is not always possible to determine if the hyperglycemia will go away after the steroids are stopped. A special blood test called an A1c is sometimes done to determine if your blood glucose was elevated before the steroids were started. SYMPTOMS  Thirsty.  Frequent urination.  Dry mouth.  Blurred vision.  Tired or fatigue.  Weakness.  Sleepy.  Tingling in feet or leg. DIAGNOSIS  Diagnosis is made by monitoring blood glucose in one or all of the following ways:  A1c test. This is a chemical found in your blood.  Fingerstick blood glucose monitoring.  Laboratory results. TREATMENT  First, knowing the cause of the hyperglycemia is important before the hyperglycemia can be treated. Treatment may include, but is not be limited to:  Education.  Change or adjustment in medications.  Change or adjustment in meal plan.  Treatment for an illness, infection, etc.  More frequent blood glucose monitoring.  Change in exercise plan.  Decreasing or stopping steroids.  Lifestyle changes. HOME CARE INSTRUCTIONS   Test your blood glucose as directed.  Exercise regularly. Your caregiver will give you instructions  about exercise. Pre-diabetes or diabetes which comes on with stress is helped by exercising.  Eat wholesome, balanced meals. Eat often and at regular, fixed times. Your caregiver or nutritionist will give you a meal plan to guide your sugar intake.  Being at an ideal weight is important. If needed, losing as little as 10 to 15 pounds may help improve blood glucose levels. SEEK MEDICAL CARE IF:   You have questions about medicine, activity, or diet.  You continue to have symptoms (problems such as increased thirst, urination, or weight gain). SEEK IMMEDIATE MEDICAL CARE IF:   You are vomiting or have diarrhea.  Your breath smells fruity.  You are breathing faster or slower.  You are very sleepy or incoherent.  You have numbness, tingling, or pain in your feet or hands.  You have chest pain.  Your symptoms get worse even though you have been following your caregiver's orders.  If you have any other questions or concerns. Document Released: 09/17/2000 Document Revised: 06/16/2011 Document Reviewed: 07/21/2011 [facility] Patient Information 2013 [place], [place].

## 2012-07-28 ENCOUNTER — Ambulatory Visit (INDEPENDENT_AMBULATORY_CARE_PROVIDER_SITE_OTHER): Payer: 59 | Admitting: Physician Assistant

## 2012-07-28 DIAGNOSIS — Z0000 Encounter for general adult medical examination without abnormal findings: Secondary | ICD-10-CM

## 2012-07-28 DIAGNOSIS — E8881 Metabolic syndrome: Secondary | ICD-10-CM

## 2012-07-28 NOTE — Progress Notes (Signed)
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  He has a hx HTN.  No DM2, HL.  Non-smoker.  He has a significant FHx of CAD (brother died of MI in 51s).  He has chronic shoulder pain.  He wants to start a new exercise program.  No chest pain, syncope.  Exam unremarkable.    Exercise Treadmill Test  Pre-Exercise Testing Evaluation Rhythm: normal sinus  Rate: 75                 Test  Exercise Tolerance Test Ordering MD: Evelena Peat, MD  Interpreting MD: Tereso Newcomer, PA-C  Unique Test No: 1  Treadmill:  1  Indication for ETT: Ischemia Evaluation  Contraindication to ETT: No   Stress Modality: exercise - treadmill  Cardiac Imaging Performed: non   Protocol: standard Bruce - maximal  Max BP:  216/82  Max MPHR (bpm):  173 85% MPR (bpm):  147  MPHR obtained (bpm):  169 % MPHR obtained:  97%  Reached 85% MPHR (min:sec):  4:58 Total Exercise Time (min-sec):  7:00  Workload in METS:  8.3 Borg Scale: 18  Reason ETT Terminated:  fatigue    ST Segment Analysis At Rest: normal ST segments - no evidence of significant ST depression With Exercise: non-specific ST changes  Other Information Arrhythmia:  No Angina during ETT:  absent (0) Quality of ETT:  diagnostic  ETT Interpretation:  normal - no evidence of ischemia by ST analysis  Comments: Fair exercise tolerance. No chest pain. Normal BP response to exercise. No ST-T changes to suggest ischemia.   Recommendations: F/u with Kristian Covey, MD as planned. SignedTereso Newcomer, PA-C  10:05 AM 07/28/2012

## 2012-10-05 ENCOUNTER — Other Ambulatory Visit: Payer: Self-pay

## 2012-10-05 DIAGNOSIS — I10 Essential (primary) hypertension: Secondary | ICD-10-CM

## 2012-10-05 MED ORDER — LISINOPRIL 10 MG PO TABS: 10.0000 mg | ORAL_TABLET | Freq: Every day | ORAL | Status: DC

## 2013-02-10 ENCOUNTER — Other Ambulatory Visit: Payer: Self-pay

## 2013-04-15 ENCOUNTER — Encounter: Payer: Self-pay | Admitting: Family Medicine

## 2013-04-15 ENCOUNTER — Ambulatory Visit (INDEPENDENT_AMBULATORY_CARE_PROVIDER_SITE_OTHER): Payer: 59 | Admitting: Family Medicine

## 2013-04-15 VITALS — BP 124/80 | HR 72 | Temp 98.6°F | Wt 262.0 lb

## 2013-04-15 DIAGNOSIS — B9789 Other viral agents as the cause of diseases classified elsewhere: Secondary | ICD-10-CM

## 2013-04-15 DIAGNOSIS — B349 Viral infection, unspecified: Secondary | ICD-10-CM

## 2013-04-15 MED ORDER — OSELTAMIVIR PHOSPHATE 75 MG PO CAPS: 75.0000 mg | ORAL_CAPSULE | Freq: Two times a day (BID) | ORAL | Status: DC

## 2013-04-15 NOTE — Progress Notes (Signed)
   Subjective:    Patient ID: [name], male    DOB: 1965/06/23, 48 y.o.   MRN: [id number]  HPI Acute visit Onset yesterday of cough fever chills severe body aches and headache. His kids developed similar symptoms today. His girlfriend had similar symptoms last week. Denies any nausea or vomiting. No sore throat. No skin rash. He's taking over-the-counter analgesics with some relief. Has maintained good appetite. No abdominal pain.  Past Medical History  Diagnosis Date  . Asthma   . Hyperlipidemia   . Hypertension   . Obstructive sleep apnea     sleep study 11/20/95, no apneas, ?obstructive hypopneas?  . Giardiasis     resolved  . Diarrhea    Past Surgical History  Procedure Laterality Date  . Ent surgery  1998    reports that he has never smoked. He does not have any smokeless tobacco history on file. He reports that he drinks alcohol. His drug history is not on file. family history includes Cancer (age of onset: 7859) in his father; Colon cancer in his father; Heart disease (age of onset: 3347) in his sister. No Known Allergies    Review of Systems  Constitutional: Positive for fever, chills and fatigue.  HENT: Positive for congestion.   Respiratory: Positive for cough.   Cardiovascular: Negative for chest pain.  Musculoskeletal: Positive for myalgias.  Neurological: Positive for headaches.       Objective:   Physical Exam  Constitutional: He appears well-developed and well-nourished.  HENT:  Right Ear: External ear normal.  Left Ear: External ear normal.  Mouth/Throat: Oropharynx is clear and moist.  Neck: Neck supple.  Cardiovascular: Normal rate.   Pulmonary/Chest: Effort normal and breath sounds normal. No respiratory distress. He has no wheezes. He has no rales.  Lymphadenopathy:    He has no cervical adenopathy.          Assessment & Plan:  Viral syndrome. Suspect influenza. Tamiflu 75 mg twice a day for 5 days. Over-the-counter analgesics as  needed. Followup when necessary

## 2013-04-15 NOTE — Progress Notes (Signed)
Pre visit review using our clinic review tool, if applicable. No additional management support is needed unless otherwise documented below in the visit note. 

## 2013-04-15 NOTE — Patient Instructions (Signed)

## 2013-07-04 ENCOUNTER — Other Ambulatory Visit: Payer: Self-pay | Admitting: Family Medicine

## 2013-08-12 ENCOUNTER — Ambulatory Visit (INDEPENDENT_AMBULATORY_CARE_PROVIDER_SITE_OTHER): Payer: 59 | Admitting: Family Medicine

## 2013-08-12 ENCOUNTER — Encounter: Payer: Self-pay | Admitting: Family Medicine

## 2013-08-12 VITALS — BP 130/80 | HR 83 | Temp 97.6°F | Wt 264.0 lb

## 2013-08-12 DIAGNOSIS — Z8669 Personal history of other diseases of the nervous system and sense organs: Secondary | ICD-10-CM

## 2013-08-12 DIAGNOSIS — Z23 Encounter for immunization: Secondary | ICD-10-CM

## 2013-08-12 DIAGNOSIS — Z7189 Other specified counseling: Secondary | ICD-10-CM

## 2013-08-12 DIAGNOSIS — Z7184 Encounter for health counseling related to travel: Secondary | ICD-10-CM

## 2013-08-12 MED ORDER — PREDNISONE (PAK) 10 MG PO TABS: ORAL_TABLET | Freq: Every day | ORAL | Status: DC

## 2013-08-12 MED ORDER — CIPROFLOXACIN HCL 500 MG PO TABS: 500.0000 mg | ORAL_TABLET | Freq: Two times a day (BID) | ORAL | Status: DC

## 2013-08-12 MED ORDER — EPINEPHRINE 0.3 MG/0.3ML IJ SOAJ: 0.3000 mg | Freq: Once | INTRAMUSCULAR | Status: DC

## 2013-08-12 NOTE — Patient Instructions (Signed)
Traveling, Food and Drink Risks Unclean or not pure (contaminated) food and drink are common sources for bringing infection into the body, especially the digestive system (gastroenteritis). This can cause nausea, stomach cramping, diarrhea (with or without visible blood) and/or vomiting. Some of the common infections that travelers can get are:  Escherichia coli infections (E. coli).  Shigellosis or bacillary dysentery.  Giardiasis.  Campylobacter jejuni infections.  Cryptosporidiosis.  Hepatitis A.  Amebiasis. Other less common infectious disease risks for travelers include:  Typhoid fever and other salmonelloses.  Cholera.  Viral infections caused by rotavirus and noroviruses.  Various protozoan and helminthic (worm-like) parasites. Many of the infectious diseases transmitted in food and water can also be acquired when solid body wastes, or feces, contaminate food (fecal-oral route). WHAT IS TRAVELERS' DIARRHEA? The most common travel health problem is travelers' diarrhea. Between 20% and 50% of international travelers develop diarrhea each year. It often occurs in the first week of travel. However, it may occur at any time while traveling, or after returning home. The world is divided into three grades of risk:  Low-risk: [place], [place], [place], [place], [place], countries in [place] and [place].  Intermediate-risk: [place], [place], some of the [place].  High-risk: Most of [place], [place], [place], [place], [place] and [place]. In high risk places, often many people do not have access to plumbing or outhouses. The amount of contaminated stool is high and more open to flies. Poor electricity capacity can cause blackouts. This affects refrigeration, which may cause unsafe food storage. Lack of water supplies may mean a lack of sinks for hand washing by restaurant staff. People at greater risk include young adults, and those with low  immune response. This includes people with HIV/AIDS, or those taking medicines to decrease immunity. Also, people with inflammatory-bowel disease or diabetes, and people taking stomach medicines that reduce acid level (H-2 blockers or antacids). The leading cause of infection is consuming food or water contaminated with feces. Poor hygiene practice in local restaurants is thought to be the largest cause of travelers' diarrhea. The bacteria or germ that most often causes this illness is E. coli. WHAT ARE THE SYMPTOMS OF TRAVELERS' DIARRHEA? The illness often begins suddenly. It causes an increase in frequency, volume, and weight of stool. An affected person often has 4 to 5 loose, watery bowel movements each day. Other symptoms include nausea, vomiting, diarrhea, stomach cramping, bloating, fever, urgency, and general ill feeling (malaise). Most cases are not serious and go away in 1-2 days without treatment. Travelers' diarrhea is rarely life-threatening. (90% of cases resolve in 1 week. 98% resolve in 1 month.) PREVENTING TRAVELERS' DIARRHEA Reduce your exposure to potentially not pure water. Water that is chlorinated, using minimum water treatment standards used in the [place] protects against viral and bacterial (germs) diseases. Chlorine treatment alone might not kill some enteric (gut) viruses. It may not kill all infection causing parasites. Where chlorinated tap water is not available, or where hygiene and sanitation are poor, only the following might be safe to drink:   Beverages made with boiled water (tea, coffee).  Canned or bottled carbonated drinks (carbonated bottled water, soft drinks).  Beer.  Wine. When buying carbonated drinks or bottled water, always inspect the bottle seal. Make sure it has not been opened. This could mean it was refilled with unclean beverages. If you suspect a bottle seal has been tampered with, return or discard it.  Where water might be contaminated, ice could  be, also. Ice should not be used in beverages. If ice comes in contact with containers used for drinking, discard the ice. Thoroughly clean the containers, preferably with soap and hot water.  It is safer to drink a beverage directly from the can or bottle than from a questionable container. However, water on the outside of cans or bottles might not be pure. Dry wet cans or bottles before they are opened. Wipe clean the surfaces where your mouth will have contact. Also, avoid brushing your teeth with tap water.  PREVENTIVE TREATMENT OF WATER  The following methods can be used to treat water. This makes it safe for drinking and other purposes.   Boiling. This is the best method. Bring water to a rolling boil for 1 minute. Then allow it to cool to room temperature. Ice should not be added. Boiling will kill bacterial and parasitic causes of diarrhea at all altitudes. It will kill viruses at low altitudes. To kill viruses at altitudes above 2,000 meters (6,562 feet), water should be boiled for 3 minutes. Or chemical disinfection should be used after the water has boiled for 1 minute. To improve taste, add a pinch of salt to each quart. Or pour the water several times from one clean container to another.  Chemical disinfection. Iodine can be used, when boiling is not possible. However, this method might not kill all parasites, unless the water sits for 15 hours before it is drunk. Two well-tested methods for disinfection with iodine are the use of:  Tincture of iodine.  Tetraglycine hydroperiodide tablets. Examples are Globaline, Potable-Aqua, or Coghlan's. You can find these tablets at pharmacies and sporting goods stores. Follow the instructions on the label. If water is cloudy, double the number of tablets used. If water is extremely cold (less than 5 Celsius or 41 Fahrenheit), try to warm it. Increase the advised contact time to achieve reliable disinfection. Cloudy water should be strained through  a clean cloth into a container. This should remove floating matter. Then the water should be boiled. Or it can be treated with iodine. Chlorine, in various forms, can also be used for chemical disinfection. But its germ killing activity varies greatly with the acidity (pH), temperature, and content of the water. Chemically treated water is meant for short-term use only. Use iodine disinfected water for only a few weeks.   Portable filters provide various degrees of protection. Reverse-osmosis filters protect against viruses, bacteria (germs), and protozoa. However, they are expensive and large. The small pores on this type of filter get quickly plugged by muddy or cloudy water. The membranes in some filters can be damaged by chlorine. Microstrainer filters (0.1- to 0.3-micrometers), can remove bacteria and protozoa. But they do not remove viruses. To kill viruses, travelers should disinfect the water with iodine or chlorine after filtration. Filters with iodine-impregnated resins work best against bacteria. The iodine will kill some viruses. But the contact time with the iodine in the filter is too short to kill some parasites. To produce safe water, proper selection, operation, care, and maintenance of water filters is needed. Follow the instructions on the label.  As a last resort, if safe drinking water is not available, very hot tap water might be safer than cold tap water. But proper disinfection, filtering, or boiling is still advised. REDUCING RISK FROM FOOD  Reduce your exposure to potentially contaminated food. To avoid illness, travelers should select food with care. All raw food could be contaminated. Especially where hygiene and sanitation are  poor, avoid:  Salads.  Uncooked vegetables.  Unpeeled fruits or vegetables.  Unpasteurized milk and milk products, such as cheese.  Undercooked and raw meat, fish, and shellfish.  Eat only food that has been cooked and is still hot.  Eat fruit  that has been prepared by a food service provider who routinely caters to foreign travelers.  Cooked food that stands for hours at room temperature can have bacterial growth. It should be thoroughly reheated before serving. Avoid foods that may have been sitting for some time in the kitchen or in a buffet.  Do not eat food purchased from street vendors. Consuming food and beverages purchased from street vendors has been linked to increased risk of illness.  Eat at restaurants that often cater to foreign travelers (leading hotels, hotel chains).  To guarantee safe food for an infant younger than 6 months of age, breastfeed. If the infant has already been weaned from the breast, formula prepared from commercial powder and boiled water is the safest food.  Carry small containers of hand-sanitizing solutions or gels (with at least 60% alcohol). This makes it easier to clean your hands before eating.  Some fish and shellfish contain poisonous biotoxins (such as ciguatoxin), even when cooked. Barracuda is the most toxin filled. It should always be avoided. Red snapper, grouper, amber jack, sea bass, and other tropical reef fish contain the toxin at various times. Poisoning potential exists in all areas where those fish are eaten. Especially in subtropical and tropical island areas of the [place], Pacific, and Indian Oceans. Symptoms of this poisoning include gastroenteritis. That is followed by:  Neurologic problems, such as dysesthesias (impaired senses, especially touch).  Temperature reversal.  Weakness.  Rarely, low blood pressure (hypotension).  Scombroid is another common fish poisoning. It occurs worldwide in tropical and temperate regions. Fish of the Scombridae family (bluefin, yellow fin tuna, mackerel, bonito), and some non-scombroid fish (mahi mahi, herring, amber jack, bluefish) may contain high levels of histidine. With poor refrigeration or preservation, histidine turns into  histamine. This can cause:  Flushing (becoming red faced).  Headache.  Feeling sick to your stomach (nausea).  Vomiting.  Diarrhea.  Hives (urticaria). Cholera has occurred in people who ate crab brought back from [place]. Travelers should not bring perishable seafood with them when they return to the [place] TREATMENT OF TRAVELERS' DIARRHEA  This illness often goes away without treatment. Oral rehydration (giving the body safe water and added packets of salt and sugar mixtures) can replace lost fluids and chemicals in the blood (electrolytes). This is especially important in children and people with longstanding (chronic) diseases. For severe fluid loss, the best replacement is with oral rehydration solutions (ORS), such as the WHO ORS solutions. These are widely available at stores and pharmacies in most developing countries.  When symptoms first occur, stop eating, and only drink clear liquids (only for adults) to help quiet the stomach. Travelers who develop 3 or more loose stools in an 8-hour period may benefit from antibiotic medicine. Especially if you also have nausea, vomiting, stomach cramps, fever, or blood in stools. Antibiotics are often given for 3-5 days. Some caregivers will prescribe antibiotics for people who are traveling to high risk places, to be used if and when symptoms begin. If diarrhea continues despite treatment, travelers should be evaluated by a caregiver and treated for possible parasitic infection. Control of frequent diarrhea is possible with over-the-counter medicines called anti-motility agents. These drugs reduce the amount of diarrhea by slowing   transit time in the stomach. This allows more time for food and water to be absorbed. It is thought that diarrhea may be a defense mechanism the body uses, to reduce the contact time between infection causing germs and the stomach. Anti-motility drugs decrease the duration of diarrhea. But they should never be used by  travelers with fever or bloody diarrhea. Such drugs can increase the severity of disease by slowing the removal of germs from the body. If excessively or not properly taken, anti-motility agents can cause serious illness on their own. Studies have found that people who follow the above eating rules still get ill sometimes. Antibiotic prevention before infection occurs is not advised for most travelers. Exceptions include those who have a high risk of serious health problems if treated after infection. However, studies have shown that the active ingredient in Pepto-Bismol (bismuth subsalicylate, BSS), taken regularly while traveling, can offer some protection against travelers' diarrhea. Ask your caregiver about this option before traveling, if desired. Do not use in children. This Information Courtesy of CDC. Document Released: 06/14/2002 Document Revised: 06/16/2011 Document Reviewed: 01/23/2009 [facility] Patient Information 2014 [employer], [place].  Consider Hepatitis A booster in 6 months.

## 2013-08-12 NOTE — Progress Notes (Signed)
Pre visit review using our clinic review tool, if applicable. No additional management support is needed unless otherwise documented below in the visit note. 

## 2013-08-12 NOTE — Progress Notes (Signed)
   Subjective:    Patient ID: [name], male    DOB: 1965/07/12, 48 y.o.   MRN: [id number]  HPI Patient is here for travel medicine consultation. Upcoming trip to [place] in one week. His tetanus is up-to-date. No history of hepatitis a. [place] and he has been told that malaria risk is low for this region. He has history of asthma and has had some severe allergic reactions previously and is requesting specifically prescriptions for prednisone Dosepak along with EpiPen and also Cipro for possible traveler's diarrhea. He is not sure whether he had previous typhoid vaccination.  He had recurrent ear infections. 2 weeks ago was treated for recurrent infection. He has chronic perforation right TM. No recent fevers or chills.  Past Medical History  Diagnosis Date  . Asthma   . Hyperlipidemia   . Hypertension   . Obstructive sleep apnea     sleep study 11/20/95, no apneas, ?obstructive hypopneas?  . Giardiasis     resolved  . Diarrhea    Past Surgical History  Procedure Laterality Date  . Ent surgery  1998    reports that he has never smoked. He does not have any smokeless tobacco history on file. He reports that he drinks alcohol. His drug history is not on file. family history includes Cancer (age of onset: 4359) in his father; Colon cancer in his father; Heart disease (age of onset: 6547) in his sister. No Known Allergies    Review of Systems  Constitutional: Negative for fever, chills and appetite change.  HENT: Negative for congestion, ear discharge and hearing loss.   Respiratory: Negative for cough and shortness of breath.   Cardiovascular: Negative for chest pain.       Objective:   Physical Exam  Constitutional: He appears well-developed and well-nourished.  HENT:  Left Ear: External ear normal.  Patient has chronic perforation right eardrum but no active drainage. He has tympanostomy tube in the ear canal this is out of place    Neck: Neck supple.  Cardiovascular: Normal rate and regular rhythm.   Pulmonary/Chest: Effort normal and breath sounds normal. No respiratory distress. He has no wheezes. He has no rales.  Lymphadenopathy:    He has no cervical adenopathy.          Assessment & Plan:  #1 travel medicine consultation. Hepatitis A given and recommend booster in 6 months. Tetanus up-to-date. We discussed typhoid vaccination but ideally needs 2 weeks for full immunity. He declines typhoid vaccination. Cipro written for possible traveler's diarrhea. We also gave him prescriptions for prednisone taper and EpiPen for any severe allergic reactions. He is at low risk area for malaria and mosquito prevention discussed #2 recurrent ear infections with chronic perforation right eardrum. No signs of acute infection

## 2013-08-31 ENCOUNTER — Telehealth: Payer: Self-pay | Admitting: Family Medicine

## 2013-08-31 NOTE — Telephone Encounter (Signed)
Pt returned from [place] w/ diarrhea. Has started Cipro,  would like to speak w. Dr Caryl Never and ask him a few questions about his symptoms. Pt refused triage nurse, stating Dr Caryl Never knows about his trip and would prefer to speak w/ him if that is ok.

## 2013-08-31 NOTE — Telephone Encounter (Signed)
Spoke with patient.  He is already on Cipro and will try adding some Imodium.

## 2013-10-21 ENCOUNTER — Encounter: Payer: Self-pay | Admitting: Family Medicine

## 2013-10-21 ENCOUNTER — Ambulatory Visit (INDEPENDENT_AMBULATORY_CARE_PROVIDER_SITE_OTHER): Payer: 59 | Admitting: Family Medicine

## 2013-10-21 VITALS — BP 130/80 | HR 84 | Temp 98.6°F | Wt 260.0 lb

## 2013-10-21 DIAGNOSIS — K219 Gastro-esophageal reflux disease without esophagitis: Secondary | ICD-10-CM

## 2013-10-21 DIAGNOSIS — J309 Allergic rhinitis, unspecified: Secondary | ICD-10-CM

## 2013-10-21 DIAGNOSIS — I10 Essential (primary) hypertension: Secondary | ICD-10-CM

## 2013-10-21 MED ORDER — FLUTICASONE PROPIONATE 50 MCG/ACT NA SUSP: 2.0000 | Freq: Every day | NASAL | Status: DC

## 2013-10-21 MED ORDER — OMEPRAZOLE 20 MG PO CPDR: 20.0000 mg | DELAYED_RELEASE_CAPSULE | Freq: Every day | ORAL | Status: DC

## 2013-10-21 MED ORDER — LISINOPRIL 10 MG PO TABS: 10.0000 mg | ORAL_TABLET | Freq: Every day | ORAL | Status: DC

## 2013-10-21 NOTE — Progress Notes (Signed)
Pre visit review using our clinic review tool, if applicable. No additional management support is needed unless otherwise documented below in the visit note. 

## 2013-10-21 NOTE — Progress Notes (Signed)
   Subjective:    Patient ID: [name], male    DOB: 08-Nov-1965, 48 y.o.   MRN: [id number]  HPI Patient here for medical followup. He has history of GERD, hypertension, seasonal and perennial allergies. Has tried tapering off omeprazole several times without success. Controlled symptoms with omeprazole. No recent dysphagia. He is trying to lose some weight.  Blood pressure well controlled. . He has lost 4 pounds since last visit. Brother with type 2 diabetes. Allergies treated with Flonase. Good control.  Past Medical History  Diagnosis Date  . Asthma   . Hyperlipidemia   . Hypertension   . Obstructive sleep apnea     sleep study 11/20/95, no apneas, ?obstructive hypopneas?  . Giardiasis     resolved  . Diarrhea    Past Surgical History  Procedure Laterality Date  . Ent surgery  1998    reports that he has never smoked. He does not have any smokeless tobacco history on file. He reports that he drinks alcohol. His drug history is not on file. family history includes Cancer (age of onset: 8659) in his father; Colon cancer in his father; Heart disease (age of onset: 6747) in his sister. No Known Allergies    Review of Systems  Constitutional: Negative for fatigue.  Eyes: Negative for visual disturbance.  Respiratory: Negative for cough, chest tightness and shortness of breath.   Cardiovascular: Negative for chest pain, palpitations and leg swelling.  Neurological: Negative for dizziness, syncope, weakness, light-headedness and headaches.       Objective:   Physical Exam  Constitutional: He is oriented to person, place, and time. He appears well-developed and well-nourished.  HENT:  Right Ear: External ear normal.  Left Ear: External ear normal.  Mouth/Throat: Oropharynx is clear and moist.  Eyes: Pupils are equal, round, and reactive to light.  Neck: Neck supple. No thyromegaly present.  Cardiovascular: Normal rate and regular rhythm.   Pulmonary/Chest: Effort normal  and breath sounds normal. No respiratory distress. He has no wheezes. He has no rales.  Musculoskeletal: He exhibits no edema.  Neurological: He is alert and oriented to person, place, and time.          Assessment & Plan:  #1 hypertension. Well controlled. Refill lisinopril for one year #2 GERD stable and well controlled. Refill omeprazole for one year #3 allergic rhinitis seasonal and perennial stable refill Flonase for one year

## 2013-10-22 ENCOUNTER — Telehealth: Payer: Self-pay | Admitting: Family Medicine

## 2013-10-22 NOTE — Telephone Encounter (Signed)
Relevant patient education mailed to patient.  

## 2013-10-23 DIAGNOSIS — J309 Allergic rhinitis, unspecified: Secondary | ICD-10-CM | POA: Insufficient documentation

## 2013-10-23 DIAGNOSIS — K219 Gastro-esophageal reflux disease without esophagitis: Secondary | ICD-10-CM | POA: Insufficient documentation

## 2013-10-23 DIAGNOSIS — E669 Obesity, unspecified: Secondary | ICD-10-CM | POA: Insufficient documentation

## 2014-01-11 ENCOUNTER — Telehealth: Payer: Self-pay | Admitting: Family Medicine

## 2014-01-11 NOTE — Telephone Encounter (Signed)
Last visit 10/21/13

## 2014-01-11 NOTE — Telephone Encounter (Signed)
Robaxin 500 mg po q 6 hours prn muscle spasm.  #30 with one refill.

## 2014-01-11 NOTE — Telephone Encounter (Signed)
Pt called to say he is having neck and back pain and would like to know if Dr Caryl NeverBurchette would rx him ROBAXIN    [facility]

## 2014-01-12 MED ORDER — METHOCARBAMOL 500 MG PO TABS: 500.0000 mg | ORAL_TABLET | Freq: Four times a day (QID) | ORAL | Status: DC | PRN

## 2014-01-12 NOTE — Telephone Encounter (Signed)
Rx sent to pharmacy   

## 2014-04-08 IMAGING — CR DG TOE GREAT 2+V*L*
3 series · 3 of 3 positions shown · non-contrast
Comparison: None

CLINICAL DATA: Pain and swelling.

LEFT GREAT TOE

[view not recorded (1 of 3)]
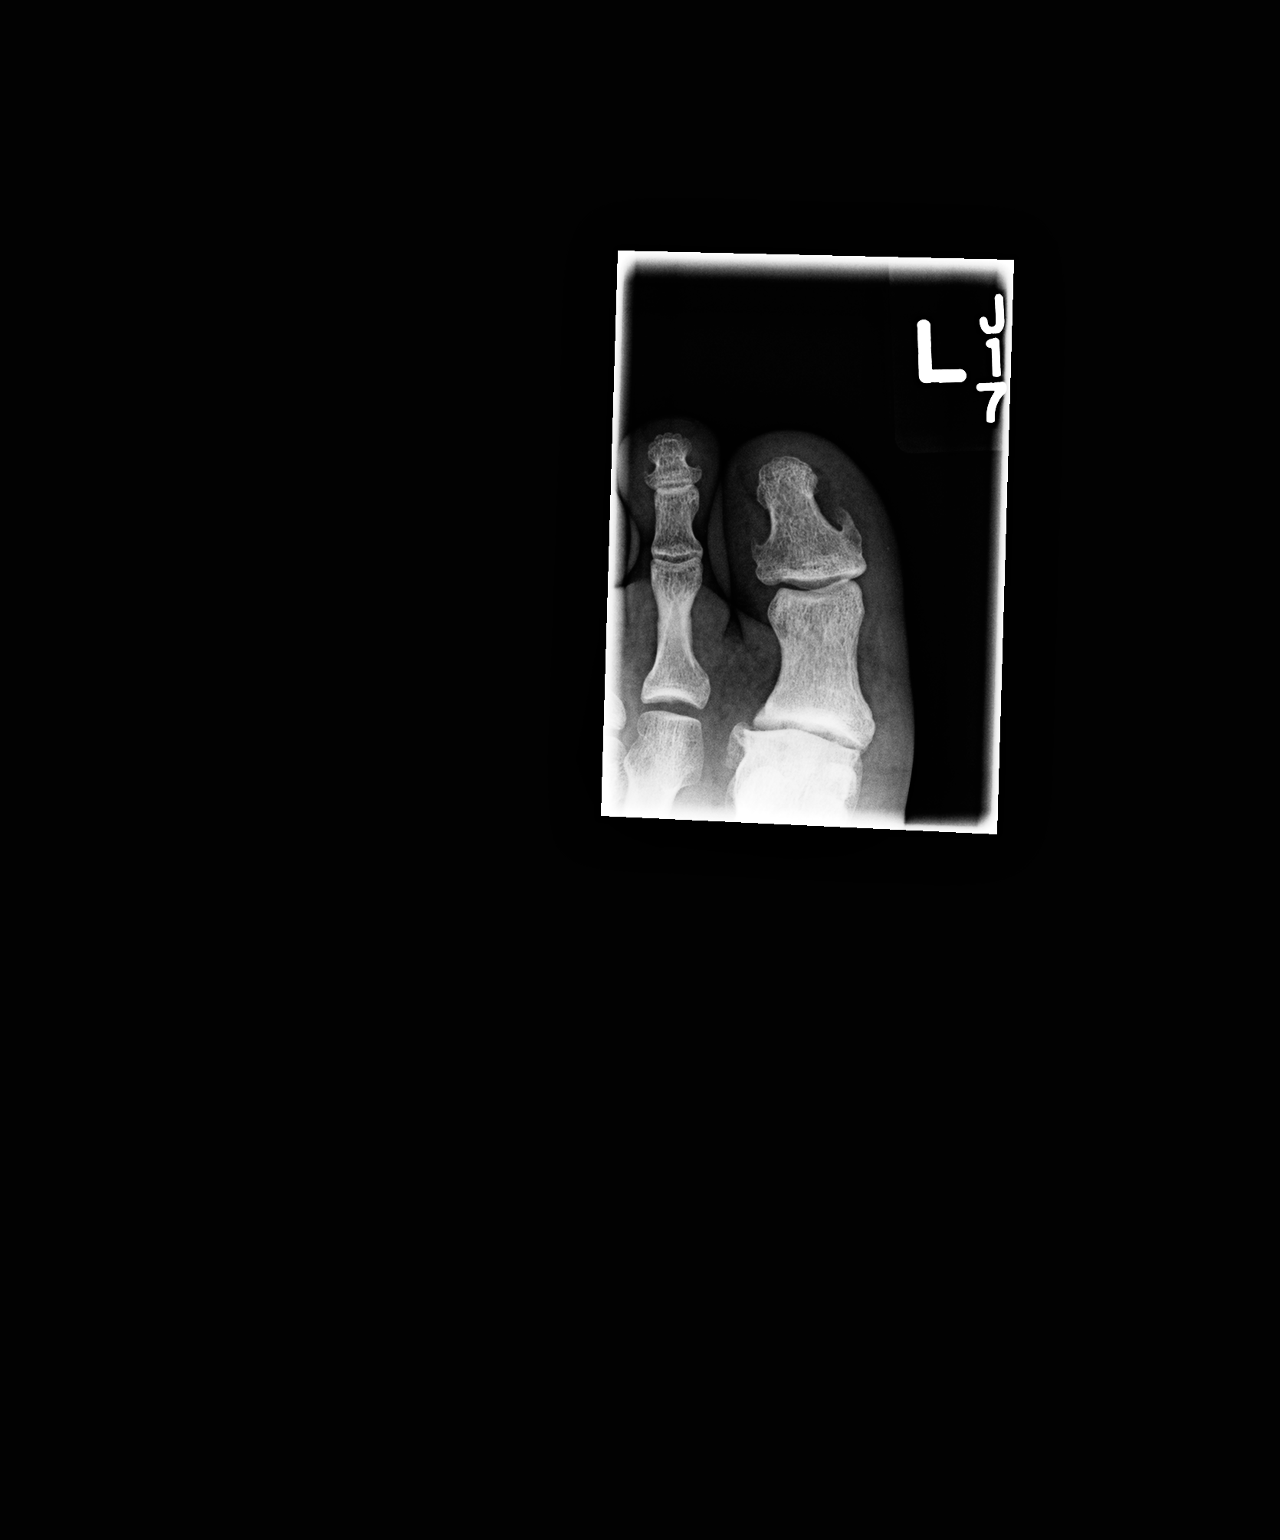

[view not recorded (2 of 3)]
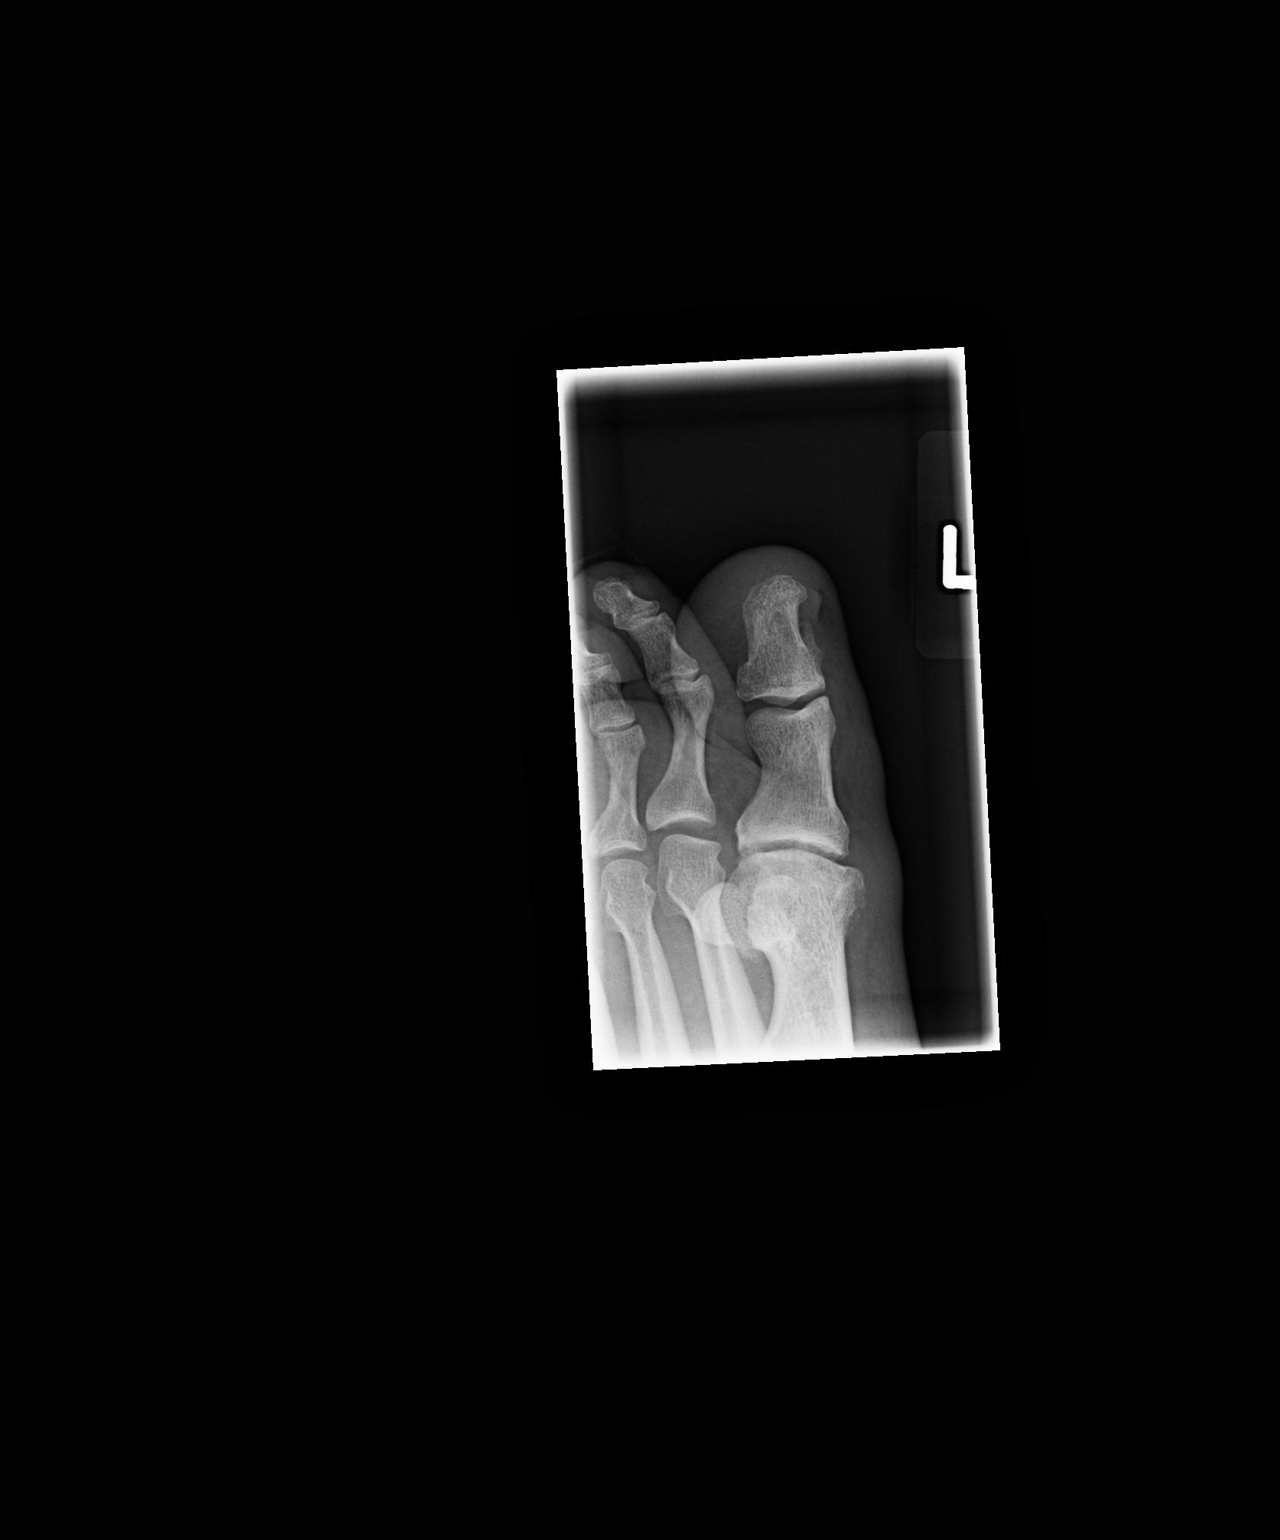

[view not recorded (3 of 3)]
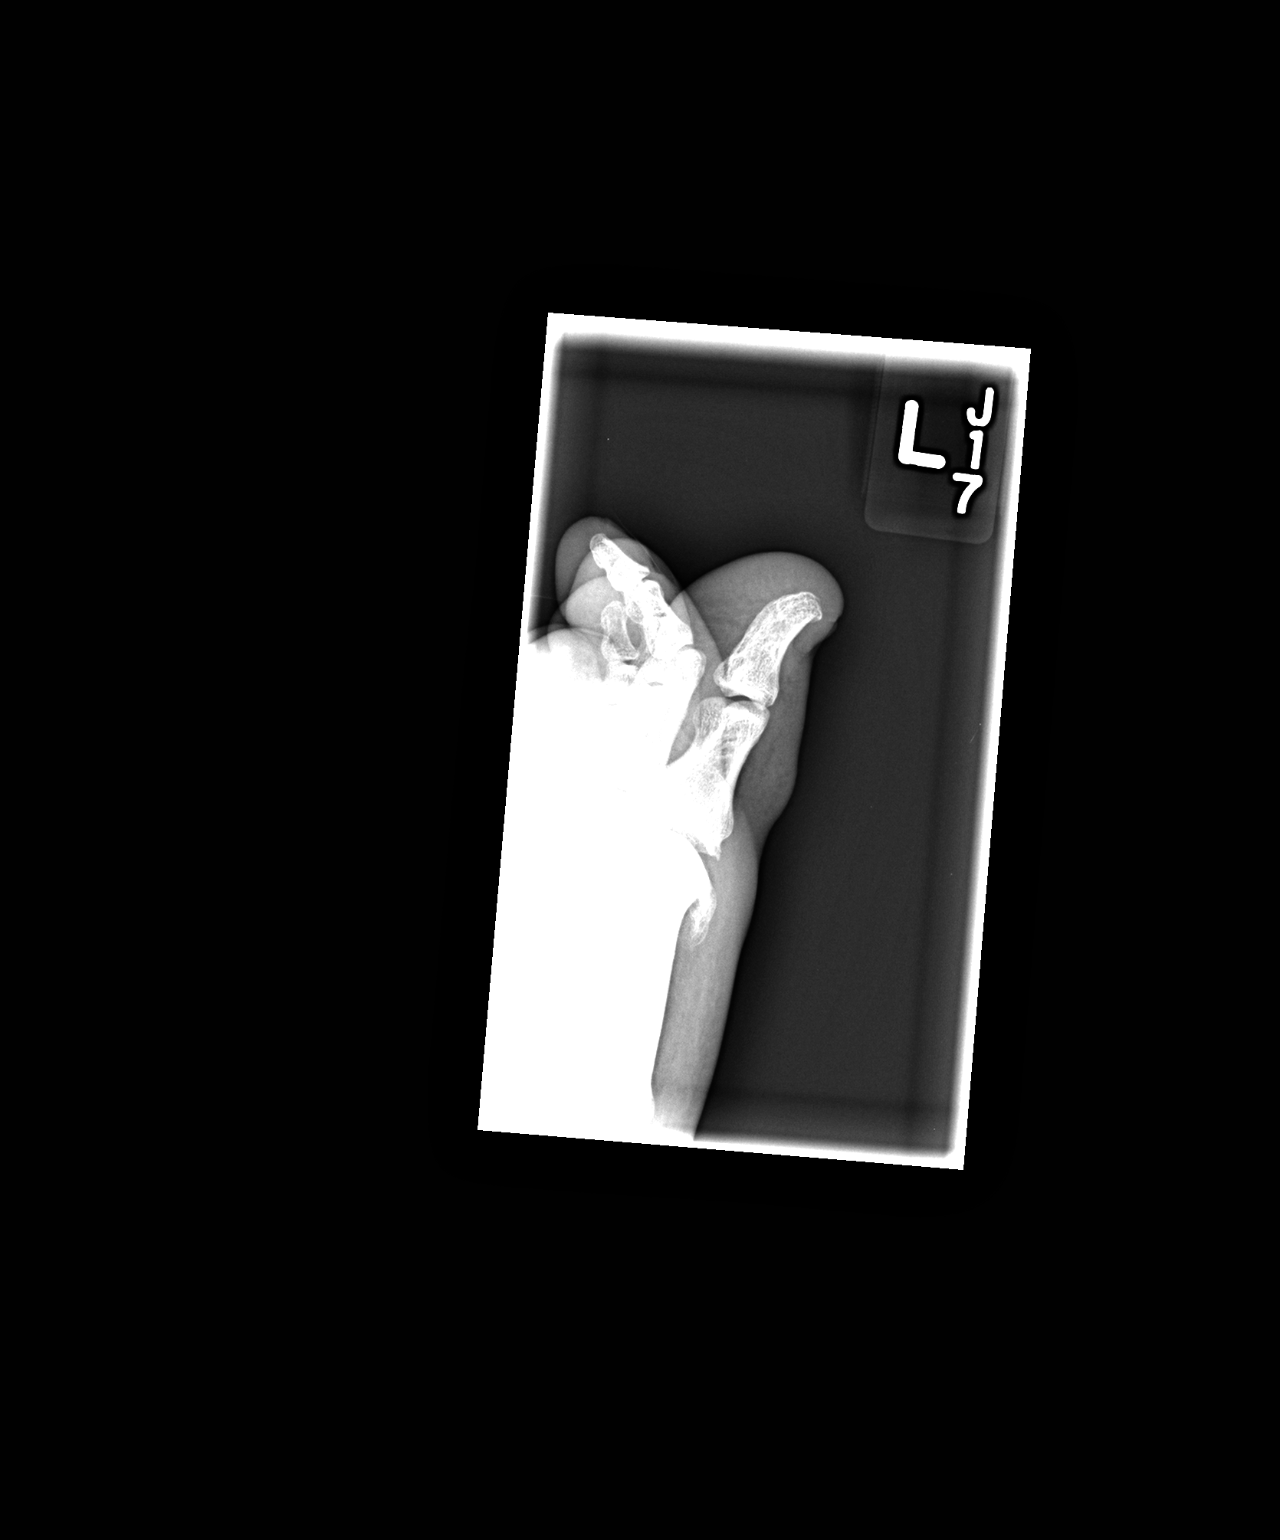

[3 of 3 positions shown; findings below may reference images not displayed]

FINDINGS: There are degenerative changes at the metatarsal
phalangeal joint with joint space narrowing and spurring changes.
The interphalangeal joint is maintained.  No acute bony findings or
destructive bony changes.
IMPRESSION: 1.  Degenerative changes at the first metatarsal phalangeal joint.
2.  No acute bony findings or destructive bony changes.

## 2014-09-12 ENCOUNTER — Ambulatory Visit (INDEPENDENT_AMBULATORY_CARE_PROVIDER_SITE_OTHER): Payer: 59 | Admitting: Family Medicine

## 2014-09-12 ENCOUNTER — Encounter: Payer: Self-pay | Admitting: Family Medicine

## 2014-09-12 VITALS — BP 130/80 | HR 96 | Temp 98.4°F | Wt 269.0 lb

## 2014-09-12 DIAGNOSIS — S30861A Insect bite (nonvenomous) of abdominal wall, initial encounter: Secondary | ICD-10-CM | POA: Diagnosis not present

## 2014-09-12 DIAGNOSIS — W57XXXA Bitten or stung by nonvenomous insect and other nonvenomous arthropods, initial encounter: Secondary | ICD-10-CM

## 2014-09-12 NOTE — Progress Notes (Signed)
Pre visit review using our clinic review tool, if applicable. No additional management support is needed unless otherwise documented below in the visit note. 

## 2014-09-12 NOTE — Patient Instructions (Signed)
Tick Bite Information Ticks are insects that attach themselves to the skin and draw blood for food. There are various types of ticks. Common types include wood ticks and deer ticks. Most ticks live in shrubs and grassy areas. Ticks can climb onto your body when you make contact with leaves or grass where the tick is waiting. The most common places on the body for ticks to attach themselves are the scalp, neck, armpits, waist, and groin. Most tick bites are harmless, but sometimes ticks carry germs that cause diseases. These germs can be spread to a person during the tick's feeding process. The chance of a disease spreading through a tick bite depends on:   The type of tick.  Time of year.   How long the tick is attached.   Geographic location.  HOW CAN YOU PREVENT TICK BITES? Take these steps to help prevent tick bites when you are outdoors:  Wear protective clothing. Long sleeves and long pants are best.   Wear white clothes so you can see ticks more easily.  Tuck your pant legs into your socks.   If walking on a trail, stay in the middle of the trail to avoid brushing against bushes.  Avoid walking through areas with long grass.  Put insect repellent on all exposed skin and along boot tops, pant legs, and sleeve cuffs.   Check clothing, hair, and skin repeatedly and before going inside.   Brush off any ticks that are not attached.  Take a shower or bath as soon as possible after being outdoors.  WHAT IS THE PROPER WAY TO REMOVE A TICK? Ticks should be removed as soon as possible to help prevent diseases caused by tick bites. 1. If latex gloves are available, put them on before trying to remove a tick.  2. Using fine-point tweezers, grasp the tick as close to the skin as possible. You may also use curved forceps or a tick removal tool. Grasp the tick as close to its head as possible. Avoid grasping the tick on its body. 3. Pull gently with steady upward pressure until  the tick lets go. Do not twist the tick or jerk it suddenly. This may break off the tick's head or mouth parts. 4. Do not squeeze or crush the tick's body. This could force disease-carrying fluids from the tick into your body.  5. After the tick is removed, wash the bite area and your hands with soap and water or other disinfectant such as alcohol. 6. Apply a small amount of antiseptic cream or ointment to the bite site.  7. Wash and disinfect any instruments that were used.  Do not try to remove a tick by applying a hot match, petroleum jelly, or fingernail polish to the tick. These methods do not work and may increase the chances of disease being spread from the tick bite.  WHEN SHOULD YOU SEEK MEDICAL CARE? Contact your health care provider if you are unable to remove a tick from your skin or if a part of the tick breaks off and is stuck in the skin.  After a tick bite, you need to be aware of signs and symptoms that could be related to diseases spread by ticks. Contact your health care provider if you develop any of the following in the days or weeks after the tick bite:  Unexplained fever.  Rash. A circular rash that appears days or weeks after the tick bite may indicate the possibility of Lyme disease. The rash may resemble   a target with a bull's-eye and may occur at a different part of your body than the tick bite.  Redness and swelling in the area of the tick bite.   Tender, swollen lymph glands.   Diarrhea.   Weight loss.   Cough.   Fatigue.   Muscle, joint, or bone pain.   Abdominal pain.   Headache.   Lethargy or a change in your level of consciousness.  Difficulty walking or moving your legs.   Numbness in the legs.   Paralysis.  Shortness of breath.   Confusion.   Repeated vomiting.  Document Released: 03/21/2000 Document Revised: 01/12/2013 Document Reviewed: 09/01/2012 Exit[employer] Patient Information 2015 [employer]. This information is  not intended to replace advice given to you by your health care provider. Make sure you discuss any questions you have with your health care provider.  

## 2014-09-12 NOTE — Progress Notes (Signed)
   Subjective:    Patient ID: [name], male    DOB: June 17, 1965, 49 y.o.   MRN: [id number]  HPI Patient seen with tick bite about 3 weeks ago. He described this as a normal wood sized tick left trunk region. Slightly itchy and slightly red since then. Minimal induration. Nonpainful. He denies any new headache, fevers, chills. No myalgias.  Past Medical History  Diagnosis Date  . Asthma   . Hyperlipidemia   . Hypertension   . Obstructive sleep apnea     sleep study 11/20/95, no apneas, ?obstructive hypopneas?  . Giardiasis     resolved  . Diarrhea    Past Surgical History  Procedure Laterality Date  . Ent surgery  1998    reports that he has never smoked. He does not have any smokeless tobacco history on file. He reports that he drinks alcohol. His drug history is not on file. family history includes Cancer (age of onset: 459) in his father; Colon cancer in his father; Heart disease (age of onset: 747) in his sister. No Known Allergies    Review of Systems  Constitutional: Negative for fever and chills.  Neurological: Negative for headaches.       Objective:   Physical Exam  Constitutional: He appears well-developed and well-nourished.  Cardiovascular: Normal rate and regular rhythm.   Pulmonary/Chest: Effort normal and breath sounds normal. No respiratory distress. He has no wheezes.  Skin:  Left flank area reveals approximately 1/2 cm slightly erythematous slightly raised indurated nontender area which is nonfluctuant. No visible retained tick parts          Assessment & Plan:  Recent tick bite left flank with local allergic reaction. No evidence for retained foreign body. Reassurance. He does not have any worrisome symptoms such as headache, fever, or worrisome rash.

## 2014-10-18 ENCOUNTER — Encounter: Payer: Self-pay | Admitting: *Deleted

## 2014-10-18 ENCOUNTER — Emergency Department
Admission: EM | Admit: 2014-10-18 | Discharge: 2014-10-18 | Disposition: A | Discharge status: Home / Self Care | Payer: 59 | Source: Home / Self Care | Attending: Family Medicine | Admitting: Family Medicine

## 2014-10-18 DIAGNOSIS — H1013 Acute atopic conjunctivitis, bilateral: Secondary | ICD-10-CM

## 2014-10-18 MED ORDER — OLOPATADINE HCL 0.2 % OP SOLN: OPHTHALMIC | Status: DC

## 2014-10-18 NOTE — Discharge Instructions (Signed)
If sinus congestion increases, may use Afrin nasal spray (or generic oxymetazoline) once daily for about 5 days.  Also recommend using saline nasal spray several times daily and saline nasal irrigation (AYR is a common brand).  Use Flonase nasal spray each morning after using Afrin nasal spray and saline nasal irrigation.   Allergic Conjunctivitis The conjunctiva is a thin membrane that covers the visible white part of the eyeball and the underside of the eyelids. This membrane protects and lubricates the eye. The membrane has small blood vessels running through it that can normally be seen. When the conjunctiva becomes inflamed, the condition is called conjunctivitis. In response to the inflammation, the conjunctival blood vessels become swollen. The swelling results in redness in the normally white part of the eye. The blood vessels of this membrane also react when a person has allergies and is then called allergic conjunctivitis. This condition usually lasts for as long as the allergy persists. Allergic conjunctivitis cannot be passed to another person (non-contagious). The likelihood of bacterial infection is great and the cause is not likely due to allergies if the inflamed eye has:  A sticky discharge.  Discharge or sticking together of the lids in the morning.  Scaling or flaking of the eyelids where the eyelashes come out.  Red swollen eyelids. CAUSES   Viruses.  Irritants such as foreign bodies.  Chemicals.  General allergic reactions.  Inflammation or serious diseases in the inside or the outside of the eye or the orbit (the boney cavity in which the eye sits) can cause a "red eye." SYMPTOMS   Eye redness.  Tearing.  Itchy eyes.  Burning feeling in the eyes.  Clear drainage from the eye.  Allergic reaction due to pollens or ragweed sensitivity. Seasonal allergic conjunctivitis is frequent in the spring when pollens are in the air and in the fall. DIAGNOSIS  This  condition, in its many forms, is usually diagnosed based on the history and an ophthalmological exam. It usually involves both eyes. If your eyes react at the same time every year, allergies may be the cause. While most "red eyes" are due to allergy or an infection, the role of an eye (ophthalmological) exam is important. The exam can rule out serious diseases of the eye or orbit. TREATMENT   Non-antibiotic eye drops, ointments, or medications by mouth may be prescribed if the ophthalmologist is sure the conjunctivitis is due to allergies alone.  Over-the-counter drops and ointments for allergic symptoms should be used only after other causes of conjunctivitis have been ruled out, or as your caregiver suggests. Medications by mouth are often prescribed if other allergy-related symptoms are present. If the ophthalmologist is sure that the conjunctivitis is due to allergies alone, treatment is normally limited to drops or ointments to reduce itching and burning. HOME CARE INSTRUCTIONS   Wash hands before and after applying drops or ointments, or touching the inflamed eye(s) or eyelids.  Do not let the eye dropper tip or ointment tube touch the eyelid when putting medicine in your eye.  Stop using your soft contact lenses and throw them away. Use a new pair of lenses when recovery is complete. You should run through sterilizing cycles at least three times before use after complete recovery if the old soft contact lenses are to be used. Hard contact lenses should be stopped. They need to be thoroughly sterilized before use after recovery.  Itching and burning eyes due to allergies is often relieved by using a cool cloth  applied to closed eye(s). SEEK MEDICAL CARE IF:   Your problems do not go away after two or three days of treatment.  Your lids are sticky (especially in the morning when you wake up) or stick together.  Discharge develops. Antibiotics may be needed either as drops, ointment, or by  mouth.  You have extreme light sensitivity.  An oral temperature above 102 F (38.9 C) develops.  Pain in or around the eye or any other visual symptom develops. MAKE SURE YOU:   Understand these instructions.  Will watch your condition.  Will get help right away if you are not doing well or get worse. Document Released: 06/14/2002 Document Revised: 06/16/2011 Document Reviewed: 05/10/2007 [facility] Patient Information 2015 [place], [place]. This information is not intended to replace advice given to you by your health care provider. Make sure you discuss any questions you have with your health care provider.

## 2014-10-18 NOTE — ED Notes (Signed)
Pt c/o bilateral yellow/ green eye drainage x 2 days. Denies fever.

## 2014-10-18 NOTE — ED Provider Notes (Signed)
CSN: [id number]     Arrival date & time 10/18/14  1704 History   First MD Initiated Contact with Patient 10/18/14 1745     Chief Complaint  Patient presents with  . Eye Drainage     HPI Comments: Patient noticed drainage from his left eye yesterday, followed by the right eye.  No changes in vision.  No foreign body sensation.  He feels well otherwise.  He has a history of seasonal rhinitis.    The history is provided by the patient.    Past Medical History  Diagnosis Date  . Asthma   . Hyperlipidemia   . Hypertension   . Obstructive sleep apnea     sleep study 11/20/95, no apneas, ?obstructive hypopneas?  . Giardiasis     resolved  . Diarrhea    Past Surgical History  Procedure Laterality Date  . Ent surgery  1998   Family History  Problem Relation Age of Onset  . Colon cancer Father   . Cancer Father 4359    colon  . Heart disease Sister [id number]    MI   History  Substance Use Topics  . Smoking status: Never Smoker   . Smokeless tobacco: Not on file  . Alcohol Use: Yes    Review of Systems No sore throat No cough No pleuritic pain No wheezing + nasal congestion ? post-nasal drainage No sinus pain/pressure + itchy/red eyes No earache No hemoptysis No SOB No fever/chills No nausea No vomiting No abdominal pain No diarrhea No urinary symptoms No skin rash No fatigue No myalgias No headache    Allergies  Review of patient's allergies indicates no known allergies.  Home Medications   Prior to Admission medications   Medication Sig Start Date End Date Taking? Authorizing Provider  ibuprofen (ADVIL,MOTRIN) 100 MG chewable tablet Chew by mouth every 8 (eight) hours as needed.   Yes Historical Provider, MD  albuterol (PROVENTIL HFA) 108 (90 BASE) MCG/ACT inhaler Inhale 1 puff into the lungs as needed. For wheezing or shortness of breath     Historical Provider, MD  EPINEPHrine (EPIPEN) 0.3 mg/0.3 mL IJ SOAJ injection Inject 0.3 mLs (0.3 mg total) into the  muscle once. 08/12/13   Kristian CoveyBruce W Burchette, MD  fluticasone (FLONASE) 50 MCG/ACT nasal spray Place 2 sprays into both nostrils daily. 10/21/13   Kristian CoveyBruce W Burchette, MD  lisinopril (PRINIVIL,ZESTRIL) 10 MG tablet Take 1 tablet (10 mg total) by mouth daily. 10/21/13   Kristian CoveyBruce W Burchette, MD  methocarbamol (ROBAXIN) 500 MG tablet Take 1 tablet (500 mg total) by mouth every 6 (six) hours as needed for muscle spasms. 01/12/14   Kristian CoveyBruce W Burchette, MD  Olopatadine HCl 0.2 % SOLN Place one drop in each eye once daily for allergic symptoms 10/18/14   Lattie HawStephen A Derian Pfost, MD  omeprazole (PRILOSEC) 20 MG capsule Take 1 capsule (20 mg total) by mouth daily. 10/21/13   Kristian CoveyBruce W Burchette, MD   BP 125/83 mmHg  Pulse 77  Temp(Src) 98.2 F (36.8 C) (Oral)  Resp 18  Ht 5\' 11"  (1.803 m)  Wt 269 lb (122.018 kg)  BMI 37.53 kg/m2  SpO2 96% Physical Exam Nursing notes and Vital Signs reviewed. Appearance:  Patient appears stated age, and in no acute distress.  Patient is obese (BMI 37.5) Eyes:  Pupils are equal, round, and reactive to light and accomodation.  Extraocular movement is intact.  Conjunctivae are not inflamed.  No lid swelling or tenderness to palpation.  Fundi benign  Ears:  Canals normal.  Tympanic membranes normal.  Nose:  Mildly congested turbinates.  No sinus tenderness.   Pharynx:  Normal Neck:  Supple.  No adenopathy Lungs:  Clear to auscultation.  Breath sounds are equal.  Moving air well. Heart:  Regular rate and rhythm without murmurs, rubs, or gallops.  Abdomen:  Nontender  Extremities:  No edema.   Skin:  No rash present.   ED Course  Procedures  none   MDM   1. Allergic conjunctivitis, bilateral    Begin Pataday opththalmic soln once daily. If sinus congestion increases, may use Afrin nasal spray (or generic oxymetazoline) once daily for about 5 days.  Also recommend using saline nasal spray several times daily and saline nasal irrigation (AYR is a common brand).  Use Flonase nasal spray  each morning after using Afrin nasal spray and saline nasal irrigation. Followup with Family Doctor if not improved in one week.     Lattie Haw, MD 10/20/14 [phone]

## 2014-10-22 ENCOUNTER — Telehealth: Payer: Self-pay | Admitting: Emergency Medicine

## 2014-10-31 ENCOUNTER — Other Ambulatory Visit: Payer: Self-pay | Admitting: *Deleted

## 2014-10-31 DIAGNOSIS — I10 Essential (primary) hypertension: Secondary | ICD-10-CM

## 2014-10-31 MED ORDER — LISINOPRIL 10 MG PO TABS: 10.0000 mg | ORAL_TABLET | Freq: Every day | ORAL | Status: DC

## 2014-11-03 ENCOUNTER — Other Ambulatory Visit: Payer: Self-pay | Admitting: Family Medicine

## 2014-11-03 MED ORDER — OMEPRAZOLE 20 MG PO CPDR: 20.0000 mg | DELAYED_RELEASE_CAPSULE | Freq: Every day | ORAL | Status: DC

## 2014-11-13 ENCOUNTER — Telehealth: Payer: Self-pay | Admitting: Family Medicine

## 2014-11-13 DIAGNOSIS — I10 Essential (primary) hypertension: Secondary | ICD-10-CM

## 2014-11-13 MED ORDER — LISINOPRIL 10 MG PO TABS: 10.0000 mg | ORAL_TABLET | Freq: Every day | ORAL | Status: DC

## 2014-11-13 MED ORDER — OMEPRAZOLE 20 MG PO CPDR: 20.0000 mg | DELAYED_RELEASE_CAPSULE | Freq: Every day | ORAL | Status: DC

## 2014-11-13 NOTE — Telephone Encounter (Signed)
Rx sent to pharmacy   

## 2014-11-13 NOTE — Telephone Encounter (Signed)
Refill request for Lisinopril 10 mg & Omeprazole 20 mg and a 90 day supply to [facility].

## 2015-05-04 MED FILL — CIPRODEX OTIC SUSPENSION: 0.3-0.1 | 10 days supply | Qty: 8 | Fill #0

## 2015-05-10 ENCOUNTER — Ambulatory Visit (INDEPENDENT_AMBULATORY_CARE_PROVIDER_SITE_OTHER): Payer: 59 | Admitting: Family Medicine

## 2015-05-10 VITALS — BP 110/88 | HR 100 | Temp 98.2°F | Ht 71.0 in | Wt 262.0 lb

## 2015-05-10 DIAGNOSIS — R062 Wheezing: Secondary | ICD-10-CM

## 2015-05-10 DIAGNOSIS — R059 Cough, unspecified: Secondary | ICD-10-CM

## 2015-05-10 DIAGNOSIS — R05 Cough: Secondary | ICD-10-CM

## 2015-05-10 MED ORDER — PREDNISONE 20 MG PO TABS: ORAL_TABLET | ORAL | Status: DC

## 2015-05-10 MED ORDER — DOXYCYCLINE HYCLATE 100 MG PO CAPS: 100.0000 mg | ORAL_CAPSULE | Freq: Two times a day (BID) | ORAL | Status: DC

## 2015-05-10 NOTE — Progress Notes (Signed)
   Subjective:    Patient ID: [name], male    DOB: Aug 04, 1965, 50 y.o.   MRN: [id number]  HPI Patient is nonsmoker is seen for acute visit with cough for one half weeks. Seems to be getting progressively worse. He does have some productive cough intermittently. He's had some occasional chills but no fever.  Using albuterol intermittently. He feels he is wheezing some at night. No hemoptysis. No pleuritic pain. Some nasal congestion. Chronic perforation right eardrum. He started Ciprodex last week. No purulent drainage.  Past Medical History  Diagnosis Date  . Asthma   . Hyperlipidemia   . Hypertension   . Obstructive sleep apnea     sleep study 11/20/95, no apneas, ?obstructive hypopneas?  . Giardiasis     resolved  . Diarrhea    Past Surgical History  Procedure Laterality Date  . Ent surgery  1998    reports that he has never smoked. He does not have any smokeless tobacco history on file. He reports that he drinks alcohol. He reports that he does not use illicit drugs. family history includes Cancer (age of onset: 66) in his father; Colon cancer in his father; Heart disease (age of onset: 13) in his sister. No Known Allergies    Review of Systems  Constitutional: Positive for chills and fatigue. Negative for fever.  HENT: Positive for congestion.   Respiratory: Positive for cough and wheezing.   Cardiovascular: Negative for chest pain.  Neurological: Negative for syncope.       Objective:   Physical Exam  Constitutional: He appears well-developed and well-nourished.  HENT:  Left Ear: External ear normal.  Mouth/Throat: Oropharynx is clear and moist.   Chronic perforation of right ear drum. No purulent drainage.  Neck: Neck supple.  Cardiovascular: Normal rate and regular rhythm.   Pulmonary/Chest: Effort normal. He has no wheezes.  Few faint crackles left base which cleared some with deep breathing  Lymphadenopathy:    He has no cervical adenopathy.           Assessment & Plan:  Cough with some associated wheezing by history. He has history of some reactive airway issues in the past. Continue albuterol as needed. Doxycycline 100 mg twice daily for 10 days. Prednisone 20 mg 2 tablets daily for 6 days. Follow-up as needed if symptoms persist or worsen

## 2015-05-10 NOTE — Progress Notes (Signed)
Pre visit review using our clinic review tool, if applicable. No additional management support is needed unless otherwise documented below in the visit note. 

## 2015-05-10 NOTE — Patient Instructions (Signed)
Follow up for any fever or increased shortness of breath. 

## 2015-06-18 ENCOUNTER — Other Ambulatory Visit: Payer: Self-pay | Admitting: Family Medicine

## 2015-06-18 MED FILL — OMEPRAZOLE DR 20 MG CAPSULE: 20 | 90 days supply | Qty: 90 | Fill #2

## 2015-06-18 MED FILL — LISINOPRIL 10 MG TABLET: 10 | 90 days supply | Qty: 90 | Fill #0

## 2015-09-12 ENCOUNTER — Other Ambulatory Visit: Payer: Self-pay | Admitting: Family Medicine

## 2015-09-12 MED FILL — FLUTICASONE PROP 50 MCG SPR: 50 | 90 days supply | Qty: 48 | Fill #0

## 2015-10-01 MED FILL — LISINOPRIL 10 MG TABLET: 10 | 90 days supply | Qty: 90 | Fill #1

## 2015-10-02 ENCOUNTER — Other Ambulatory Visit: Payer: Self-pay | Admitting: General Practice

## 2015-10-02 MED ORDER — OMEPRAZOLE 20 MG PO CPDR: 20.0000 mg | DELAYED_RELEASE_CAPSULE | Freq: Every day | ORAL | Status: DC

## 2015-10-02 MED FILL — OMEPRAZOLE DR 20 MG CAPSULE: 20 | 90 days supply | Qty: 90 | Fill #0

## 2015-12-27 ENCOUNTER — Other Ambulatory Visit (INDEPENDENT_AMBULATORY_CARE_PROVIDER_SITE_OTHER): Payer: 59

## 2015-12-27 ENCOUNTER — Telehealth: Payer: Self-pay | Admitting: Family Medicine

## 2015-12-27 DIAGNOSIS — R7989 Other specified abnormal findings of blood chemistry: Secondary | ICD-10-CM

## 2015-12-27 DIAGNOSIS — Z0000 Encounter for general adult medical examination without abnormal findings: Secondary | ICD-10-CM | POA: Diagnosis not present

## 2015-12-27 LAB — CBC WITH DIFFERENTIAL/PLATELET
BASOS PCT: 0.4 % (ref 0.0–3.0)
Basophils Absolute: 0 10*3/uL (ref 0.0–0.1)
EOS PCT: 1.9 % (ref 0.0–5.0)
Eosinophils Absolute: 0.1 10*3/uL (ref 0.0–0.7)
HEMATOCRIT: 45.8 % (ref 39.0–52.0)
HEMOGLOBIN: 15.9 g/dL (ref 13.0–17.0)
Lymphocytes Relative: 31.9 % (ref 12.0–46.0)
Lymphs Abs: 1.3 10*3/uL (ref 0.7–4.0)
MCHC: 34.7 g/dL (ref 30.0–36.0)
MCV: 91.1 fl (ref 78.0–100.0)
MONOS PCT: 7.8 % (ref 3.0–12.0)
Monocytes Absolute: 0.3 10*3/uL (ref 0.1–1.0)
Neutro Abs: 2.5 10*3/uL (ref 1.4–7.7)
Neutrophils Relative %: 58 % (ref 43.0–77.0)
Platelets: 248 10*3/uL (ref 150.0–400.0)
RBC: 5.03 Mil/uL (ref 4.22–5.81)
RDW: 13 % (ref 11.5–15.5)
WBC: 4.2 10*3/uL (ref 4.0–10.5)

## 2015-12-27 LAB — BASIC METABOLIC PANEL
BUN: 13 mg/dL (ref 6–23)
CALCIUM: 9.2 mg/dL (ref 8.4–10.5)
CO2: 30 mEq/L (ref 19–32)
Chloride: 99 mEq/L (ref 96–112)
Creatinine, Ser: 0.97 mg/dL (ref 0.40–1.50)
GFR: 86.73 mL/min (ref >60.00)
GLUCOSE: 331 mg/dL — AB (ref 70–99)
POTASSIUM: 4.7 meq/L (ref 3.5–5.1)
SODIUM: 136 meq/L (ref 135–145)

## 2015-12-27 LAB — LIPID PANEL
Cholesterol: 243 mg/dL — ABNORMAL HIGH (ref 0–200)
HDL: 30.3 mg/dL — ABNORMAL LOW (ref >39.00)
Total CHOL/HDL Ratio: 8
Triglycerides: 489 mg/dL — ABNORMAL HIGH (ref 0.0–149.0)

## 2015-12-27 LAB — HEPATIC FUNCTION PANEL
ALBUMIN: 4.1 g/dL (ref 3.5–5.2)
ALT: 22 U/L (ref 0–53)
AST: 14 U/L (ref 0–37)
Alkaline Phosphatase: 85 U/L (ref 39–117)
BILIRUBIN TOTAL: 0.7 mg/dL (ref 0.2–1.2)
Bilirubin, Direct: 0.1 mg/dL (ref 0.0–0.3)
Total Protein: 6.6 g/dL (ref 6.0–8.3)

## 2015-12-27 LAB — LDL CHOLESTEROL, DIRECT: Direct LDL: 120 mg/dL

## 2015-12-27 LAB — TSH: TSH: 1.33 u[IU]/mL (ref 0.35–4.50)

## 2015-12-27 LAB — PSA: PSA: 0.43 ng/mL (ref 0.10–4.00)

## 2015-12-27 MED ORDER — FLUTICASONE PROPIONATE 50 MCG/ACT NA SUSP: NASAL | 1 refills | Status: DC

## 2015-12-27 MED ORDER — OMEPRAZOLE 20 MG PO CPDR: 20.0000 mg | DELAYED_RELEASE_CAPSULE | Freq: Every day | ORAL | 1 refills | Status: DC

## 2015-12-27 MED ORDER — LISINOPRIL 10 MG PO TABS: 10.0000 mg | ORAL_TABLET | Freq: Every day | ORAL | 1 refills | Status: DC

## 2015-12-27 MED FILL — LISINOPRIL 10 MG TABLET: 10 | 90 days supply | Qty: 90 | Fill #0

## 2015-12-27 MED FILL — FLUTICASONE PROP 50 MCG SPR: 50 | 90 days supply | Qty: 48 | Fill #0

## 2015-12-27 MED FILL — OMEPRAZOLE DR 20 MG CAPSULE: 20 | 90 days supply | Qty: 90 | Fill #0

## 2015-12-27 NOTE — Telephone Encounter (Signed)
Rx done. 

## 2015-12-27 NOTE — Telephone Encounter (Signed)
Patient needs a refill on Flonase, Prinivil, and Prilosec.  He states he usually gets a 3 mth supply.  [facility]

## 2015-12-31 ENCOUNTER — Encounter: Payer: Self-pay | Admitting: Family Medicine

## 2015-12-31 ENCOUNTER — Ambulatory Visit (INDEPENDENT_AMBULATORY_CARE_PROVIDER_SITE_OTHER): Payer: 59 | Admitting: Family Medicine

## 2015-12-31 VITALS — BP 110/90 | HR 91 | Temp 98.2°F | Ht 71.0 in | Wt 218.3 lb

## 2015-12-31 DIAGNOSIS — E119 Type 2 diabetes mellitus without complications: Secondary | ICD-10-CM | POA: Diagnosis not present

## 2015-12-31 DIAGNOSIS — R739 Hyperglycemia, unspecified: Secondary | ICD-10-CM

## 2015-12-31 DIAGNOSIS — H6611 Chronic tubotympanic suppurative otitis media, right ear: Secondary | ICD-10-CM | POA: Diagnosis not present

## 2015-12-31 LAB — POCT GLYCOSYLATED HEMOGLOBIN (HGB A1C): HEMOGLOBIN A1C: 12.7

## 2015-12-31 LAB — GLUCOSE, POCT (MANUAL RESULT ENTRY): POC Glucose: 347 mg/dl — AB (ref 70–99)

## 2015-12-31 MED ORDER — CIPROFLOXACIN-DEXAMETHASONE 0.3-0.1 % OT SUSP: 4.0000 [drp] | Freq: Two times a day (BID) | OTIC | 1 refills | Status: DC

## 2015-12-31 MED ORDER — GLUCOSE BLOOD VI STRP: ORAL_STRIP | 2 refills | Status: DC

## 2015-12-31 MED ORDER — METFORMIN HCL 500 MG PO TABS: 500.0000 mg | ORAL_TABLET | Freq: Two times a day (BID) | ORAL | 3 refills | Status: DC

## 2015-12-31 MED ORDER — LANCETS ULTRA FINE MISC: 2 refills | Status: DC

## 2015-12-31 MED FILL — metFORMIN HCL 500 MG TABS: 500 | 30 days supply | Qty: 60 | Fill #0

## 2015-12-31 MED FILL — CIPRODEX OTIC SUSPENSION: 0.3-0.1 | 10 days supply | Qty: 8 | Fill #0

## 2015-12-31 NOTE — Progress Notes (Signed)
Subjective:     Patient ID: [name], male   DOB: 1965/04/16, 50 y.o.   MRN: [id number]  HPI Patient is seen to evaluate elevated blood sugar. He had recent labs for anticipated physical with fasting glucose of 331. He has very strong family history of type 2 diabetes in 2 brothers. He has lost about 40 pounds in weight which he had attributed initially to exercise and has made some positive dietary changes with trying to reduce sugars and starches.  Other complaint is right ear pain. He has history of chronic recurrent supperative otitis. He has chronic perforation and usually uses Ciprodex ear drops which help. He has not any recent fevers or chills.  Past Medical History:  Diagnosis Date  . Asthma   . Diarrhea   . Giardiasis    resolved  . Hyperlipidemia   . Hypertension   . Obstructive sleep apnea    sleep study 11/20/95, no apneas, ?obstructive hypopneas?   Past Surgical History:  Procedure Laterality Date  . ENT surgery  1998    reports that he has never smoked. He does not have any smokeless tobacco history on file. He reports that he drinks alcohol. He reports that he does not use drugs. family history includes Cancer (age of onset: 759) in his father; Colon cancer in his father; Heart disease (age of onset: 7847) in his sister. No Known Allergies   Review of Systems  Constitutional: Positive for unexpected weight change. Negative for fatigue.  Eyes: Negative for visual disturbance.  Respiratory: Negative for cough, chest tightness and shortness of breath.   Cardiovascular: Negative for chest pain, palpitations and leg swelling.  Neurological: Negative for dizziness, syncope, weakness, light-headedness and headaches.       Objective:   Physical Exam  Constitutional: He appears well-developed and well-nourished.  HENT:  Left eardrum appears normal. Right ear canal reveals some minimal debris. He has very distorted eardrum with distorted landmarks and moderate  erythema. Possibly some mild purulence in the canal  Neck: Neck supple.  Cardiovascular: Normal rate and regular rhythm.   Pulmonary/Chest: Effort normal and breath sounds normal. No respiratory distress. He has no wheezes. He has no rales.  Musculoskeletal: He exhibits no edema.  Lymphadenopathy:    He has no cervical adenopathy.       Assessment:     #1 New onset type 2 diabetes  #2 chronic right suppurative otitis    Plan:     -Recheck fasting glucose today and hemoglobin A1c -347 and 12.7% -Ciprodex eardrops to the right ear twice daily and will reassess at physical in a couple weeks -start metformin 500 mg po bid (may need some short term use of insulin if not responding soon). -glucose monitor given -set up diabetes education. -office follow up for CPE in about 2 weeks.  Kristian CoveyBruce W Jaidynn Balster MD [facility]

## 2015-12-31 NOTE — Patient Instructions (Signed)

## 2015-12-31 NOTE — Progress Notes (Signed)
Pre visit review using our clinic review tool, if applicable. No additional management support is needed unless otherwise documented below in the visit note. 

## 2016-01-03 ENCOUNTER — Other Ambulatory Visit: Payer: Self-pay

## 2016-01-03 ENCOUNTER — Other Ambulatory Visit: Payer: Self-pay | Admitting: *Deleted

## 2016-01-03 DIAGNOSIS — E118 Type 2 diabetes mellitus with unspecified complications: Secondary | ICD-10-CM

## 2016-01-03 MED ORDER — TRUE METRIX METER W/DEVICE KIT: PACK | 0 refills | Status: DC

## 2016-01-03 MED ORDER — LANCETS ULTRA FINE MISC: 5 refills | Status: DC

## 2016-01-03 MED ORDER — GLUCOSE BLOOD VI STRP: ORAL_STRIP | 5 refills | Status: DC

## 2016-01-03 MED FILL — TRUEplus LANCETS 30G MISC: 50 days supply | Qty: 100 | Fill #0

## 2016-01-03 MED FILL — TRUE METRIX GLUCOSE TEST ST: 50 days supply | Qty: 100 | Fill #0

## 2016-01-16 ENCOUNTER — Ambulatory Visit (INDEPENDENT_AMBULATORY_CARE_PROVIDER_SITE_OTHER): Payer: 59 | Admitting: Family Medicine

## 2016-01-16 ENCOUNTER — Encounter: Payer: Self-pay | Admitting: Family Medicine

## 2016-01-16 VITALS — BP 112/80 | HR 88 | Temp 98.2°F | Ht 71.0 in | Wt 219.8 lb

## 2016-01-16 DIAGNOSIS — Z0000 Encounter for general adult medical examination without abnormal findings: Secondary | ICD-10-CM | POA: Diagnosis not present

## 2016-01-16 DIAGNOSIS — Z23 Encounter for immunization: Secondary | ICD-10-CM

## 2016-01-16 MED ORDER — METFORMIN HCL 1000 MG PO TABS: 1000.0000 mg | ORAL_TABLET | Freq: Two times a day (BID) | ORAL | 3 refills | Status: DC

## 2016-01-16 MED ORDER — METHOCARBAMOL 500 MG PO TABS: 500.0000 mg | ORAL_TABLET | Freq: Four times a day (QID) | ORAL | 1 refills | Status: DC | PRN

## 2016-01-16 MED FILL — METHOCARBAMOL 500 MG TABLET: 500 | 7 days supply | Qty: 30 | Fill #0

## 2016-01-16 MED FILL — metFORMIN HCL 1000 MG TABS: 1000 | 90 days supply | Qty: 180 | Fill #0

## 2016-01-16 NOTE — Progress Notes (Signed)
Pre visit review using our clinic review tool, if applicable. No additional management support is needed unless otherwise documented below in the visit note. 

## 2016-01-16 NOTE — Progress Notes (Signed)
Subjective:     Patient ID: [name], male   DOB: 09-Jul-1965, 50 y.o.   MRN: [id number]  HPI Patient seen for physical exam. Recently diagnosed with type 2 diabetes. We started metformin 500 mg twice daily. His blood sugars are improving. Recent fasting blood sugars between 180 and 220. He had one preprandial blood sugar later in the day 126. No side effects from metformin. He has made some dietary changes with restricting sugar and carbohydrates. He has not yet seen diabetes educator but plans to set up soon. A1c 12.7%. He does not describe any nocturia or significant polydipsia or polyuria at this time. Has had flu vaccine. Needs Pneumovax.  Other immunizations are up-to-date. He's had previous colonoscopy. Other medical problems include history of obesity, GERD, metabolic syndrome, dyslipidemia  Past Medical History:  Diagnosis Date  . Asthma   . Diarrhea   . Giardiasis    resolved  . Hyperlipidemia   . Hypertension   . Obstructive sleep apnea    sleep study 11/20/95, no apneas, ?obstructive hypopneas?   Past Surgical History:  Procedure Laterality Date  . ENT surgery  1998    reports that he has never smoked. He has never used smokeless tobacco. He reports that he drinks alcohol. He reports that he does not use drugs. family history includes Cancer (age of onset: 7959) in his father; Colon cancer in his father; Heart disease (age of onset: 4247) in his sister. No Known Allergies   Review of Systems  Constitutional: Negative for activity change, appetite change, chills, fatigue, fever and unexpected weight change.  HENT: Negative for congestion, ear pain and trouble swallowing.   Eyes: Negative for pain and visual disturbance.  Respiratory: Negative for cough, shortness of breath and wheezing.   Cardiovascular: Negative for chest pain and palpitations.  Gastrointestinal: Negative for abdominal distention, abdominal pain, blood in stool, constipation, diarrhea, nausea, rectal  pain and vomiting.  Endocrine: Negative for polydipsia and polyuria.  Genitourinary: Negative for dysuria, hematuria and testicular pain.  Musculoskeletal: Negative for arthralgias and joint swelling.  Skin: Negative for rash.  Neurological: Negative for dizziness, syncope and headaches.  Hematological: Negative for adenopathy.  Psychiatric/Behavioral: Negative for confusion and dysphoric mood.       Objective:   Physical Exam  Constitutional: He is oriented to person, place, and time. He appears well-developed and well-nourished. No distress.  HENT:  Head: Normocephalic and atraumatic.  Right Ear: External ear normal.  Left Ear: External ear normal.  Mouth/Throat: Oropharynx is clear and moist.  Eyes: Conjunctivae and EOM are normal. Pupils are equal, round, and reactive to light.  Neck: Normal range of motion. Neck supple. No thyromegaly present.  Cardiovascular: Normal rate, regular rhythm and normal heart sounds.   No murmur heard. Pulmonary/Chest: No respiratory distress. He has no wheezes. He has no rales.  Abdominal: Soft. Bowel sounds are normal. He exhibits no distension and no mass. There is no tenderness. There is no rebound and no guarding.  Musculoskeletal: He exhibits no edema.  Lymphadenopathy:    He has no cervical adenopathy.  Neurological: He is alert and oriented to person, place, and time. He displays normal reflexes. No cranial nerve deficit.  Skin: No rash noted.  Feet reveal no skin lesions. Good distal foot pulses. Good capillary refill. No calluses. Normal sensation with monofilament testing   Psychiatric: He has a normal mood and affect.       Assessment:     Physical exam. Recent diagnosis type 2  diabetes. Blood sugars improving but still far from goal with recent fasting blood sugars are 180-220.    Plan:     -Increase metformin to 1000 mg twice a day. -Follow through with diabetes education -We discussed lifestyle measures in controlling  diabetes with exercise and diet -We'll plan follow-up in 3 months for repeat hemoglobin A1c -Continue yearly eye exam -Discussed dyslipidemia. Suspect hypertriglyceridemia will improve with improving glycemic control and will plan to repeat lipids in several months after improved glycemic control  Kristian Covey MD [facility] at [facility]

## 2016-02-14 ENCOUNTER — Encounter: Payer: Self-pay | Admitting: Skilled Nursing Facility1

## 2016-02-14 ENCOUNTER — Encounter: Payer: 59 | Attending: Family Medicine | Admitting: Skilled Nursing Facility1

## 2016-02-14 DIAGNOSIS — E119 Type 2 diabetes mellitus without complications: Secondary | ICD-10-CM

## 2016-02-14 DIAGNOSIS — Z713 Dietary counseling and surveillance: Secondary | ICD-10-CM | POA: Diagnosis not present

## 2016-02-14 NOTE — Progress Notes (Signed)
Diabetes Self-Management Education  Visit Type: First/Initial  Appt. Start Time: 10:00Appt. End Time: 11:00  02/14/2016  Mr. [name], identified by name and date of birth, is a 50 y.o. male with a diagnosis of Diabetes: Type 2.   ASSESSMENT  Height 5\' 11"  (1.803 m). There is no height or weight on file to calculate BMI. Pt states he was 263 pounds now to 218 pounds.     Diabetes Self-Management Education - 02/14/16 1009      Visit Information   Visit Type First/Initial     Initial Visit   Diabetes Type Type 2   Date Diagnosed recently     Health Coping   How would you rate your overall health? Good     Psychosocial Assessment   Patient Belief/Attitude about Diabetes Motivated to manage diabetes     Pre-Education Assessment   Patient understands the diabetes disease and treatment process. Demonstrates understanding / competency   Patient understands incorporating nutritional management into lifestyle. Demonstrates understanding / competency   Patient undertands incorporating physical activity into lifestyle. Demonstrates understanding / competency   Patient understands using medications safely. Demonstrates understanding / competency   Patient understands monitoring blood glucose, interpreting and using results Demonstrates understanding / competency   Patient understands prevention, detection, and treatment of acute complications. Demonstrates understanding / competency   Patient understands prevention, detection, and treatment of chronic complications. Demonstrates understanding / competency   Patient understands how to develop strategies to address psychosocial issues. Demonstrates understanding / competency   Patient understands how to develop strategies to promote health/change behavior. Demonstrates understanding / competency     Complications   How often do you check your blood sugar? 1-2 times/day   Fasting Blood glucose range (mg/dL) [phone]   Have you  had a dilated eye exam in the past 12 months? Yes   Have you had a dental exam in the past 12 months? Yes   Are you checking your feet? Yes   How many days per week are you checking your feet? 2     Dietary Intake   Breakfast egg whites   Snack (morning) carrot sticks and hummus    Lunch salad, 2 greek yogurt   Dinner chicken breast    Beverage(s) coffee, water, coke zero, crystal light      Exercise   Exercise Type Light (walking / raking leaves)   How many days per week to you exercise? 3   How many minutes per day do you exercise? 35   Total minutes per week of exercise 105     Patient Education   Previous Diabetes Education No   Nutrition management  Role of diet in the treatment of diabetes and the relationship between the three main macronutrients and blood glucose level;Food label reading, portion sizes and measuring food.;Carbohydrate counting;Reviewed blood glucose goals for pre and post meals and how to evaluate the patients' food intake on their blood glucose level.;Meal timing in regards to the patients' current diabetes medication.;Effects of alcohol on blood glucose and safety factors with consumption of alcohol.;Information on hints to eating out and maintain blood glucose control.   Physical activity and exercise  Role of exercise on diabetes management, blood pressure control and cardiac health.   Monitoring Purpose and frequency of SMBG.;Daily foot exams;Yearly dilated eye exam;Identified appropriate SMBG and/or A1C goals.     Individualized Goals (developed by patient)   Physical Activity Exercise 3-5 times per week;30 minutes per day     Post-Education Assessment  Patient understands the diabetes disease and treatment process. Demonstrates understanding / competency   Patient understands incorporating nutritional management into lifestyle. Demonstrates understanding / competency   Patient undertands incorporating physical activity into lifestyle. Demonstrates  understanding / competency   Patient understands using medications safely. Demonstrates understanding / competency   Patient understands monitoring blood glucose, interpreting and using results Demonstrates understanding / competency   Patient understands prevention, detection, and treatment of acute complications. Demonstrates understanding / competency   Patient understands prevention, detection, and treatment of chronic complications. Demonstrates understanding / competency   Patient understands how to develop strategies to address psychosocial issues. Demonstrates understanding / competency   Patient understands how to develop strategies to promote health/change behavior. Demonstrates understanding / competency     Outcomes   Expected Outcomes Demonstrated interest in learning. Expect positive outcomes   Future DMSE PRN   Program Status Completed      Individualized Plan for Diabetes Self-Management Training:   Learning Objective:  Patient will have a greater understanding of diabetes self-management. Patient education plan is to attend individual and/or group sessions per assessed needs and concerns.   Plan:   Patient Instructions  -Always bring your meter with you everywhere you go -Always Properly dispose of your needles:  -Discard in a hard plastic/metal container with a lid (something the needle can't puncture)  -Write Do Not Recycle on the outside of the container  -Example: A laundry detergent bottle -Never use the same needle more than once -Eat 3 carbohydrate choices for each meal and 1-2 for each snack -A meal: carbohydrates, protein, vegetable -A snack: A Fruit OR Vegetable AND Protein  -Try to be more active -Always pay attention to your body keeping watchful of possible low blood sugar (below 70) or high blood sugar (above 200)   -Check your blood sugar throughout the week at different times before you eat and 2 hours after you eat  -Carbohydrates are not bad    -Check your feet every day  -Try to eat every 3 hours never longer than 6 hours without eating    Expected Outcomes:  Demonstrated interest in learning. Expect positive outcomes  Education material provided: Living Well with Diabetes, Meal plan card and My Plate  If problems or questions, patient to contact team via:  Phone  Future DSME appointment: PRN

## 2016-02-14 NOTE — Patient Instructions (Signed)
-  Always bring your meter with you everywhere you go -Always Properly dispose of your needles:  -Discard in a hard plastic/metal container with a lid (something the needle can't puncture)  -Write Do Not Recycle on the outside of the container  -Example: A laundry detergent bottle -Never use the same needle more than once -Eat 3 carbohydrate choices for each meal and 1-2 for each snack -A meal: carbohydrates, protein, vegetable -A snack: A Fruit OR Vegetable AND Protein  -Try to be more active -Always pay attention to your body keeping watchful of possible low blood sugar (below 70) or high blood sugar (above 200)   -Check your blood sugar throughout the week at different times before you eat and 2 hours after you eat  -Carbohydrates are not bad   -Check your feet every day  -Try to eat every 3 hours never longer than 6 hours without eating

## 2016-03-17 DIAGNOSIS — E119 Type 2 diabetes mellitus without complications: Secondary | ICD-10-CM | POA: Diagnosis not present

## 2016-03-17 LAB — HM DIABETES EYE EXAM

## 2016-03-27 ENCOUNTER — Encounter: Payer: Self-pay | Admitting: Family Medicine

## 2016-03-28 MED FILL — LISINOPRIL 10 MG TABLET: 10 | 90 days supply | Qty: 90 | Fill #1

## 2016-03-28 MED FILL — OMEPRAZOLE DR 20 MG CAPSULE: 20 | 90 days supply | Qty: 90 | Fill #1

## 2016-03-28 MED FILL — FLUTICASONE PROP 50 MCG SPR: 50 | 90 days supply | Qty: 48 | Fill #1

## 2016-03-28 MED FILL — TRUEplus LANCETS 30G MISC: 50 days supply | Qty: 100 | Fill #1

## 2016-04-24 MED FILL — metFORMIN HCL 1000 MG TABS: 1000 | 90 days supply | Qty: 180 | Fill #1

## 2016-05-15 ENCOUNTER — Encounter: Payer: Self-pay | Admitting: Family Medicine

## 2016-05-15 ENCOUNTER — Ambulatory Visit (INDEPENDENT_AMBULATORY_CARE_PROVIDER_SITE_OTHER): Payer: 59 | Admitting: Family Medicine

## 2016-05-15 VITALS — BP 120/96 | HR 84 | Temp 98.3°F | Wt 235.8 lb

## 2016-05-15 DIAGNOSIS — R52 Pain, unspecified: Secondary | ICD-10-CM

## 2016-05-15 DIAGNOSIS — J4521 Mild intermittent asthma with (acute) exacerbation: Secondary | ICD-10-CM | POA: Diagnosis not present

## 2016-05-15 LAB — POCT INFLUENZA A: RAPID INFLUENZA A AGN: NEGATIVE

## 2016-05-15 MED ORDER — DOXYCYCLINE HYCLATE 100 MG PO TABS: 100.0000 mg | ORAL_TABLET | Freq: Two times a day (BID) | ORAL | 0 refills | Status: DC

## 2016-05-15 MED ORDER — PREDNISONE 10 MG PO TABS: ORAL_TABLET | ORAL | 0 refills | Status: DC

## 2016-05-15 NOTE — Progress Notes (Signed)
Pre visit review using our clinic review tool, if applicable. No additional management support is needed unless otherwise documented below in the visit note. 

## 2016-05-15 NOTE — Patient Instructions (Signed)
Please take medication as directed and follow up if symptoms do not improve in 3 to 4 days, worsen, or you develop a fever >100.    Acute Bronchitis, Adult Acute bronchitis is when air tubes (bronchi) in the lungs suddenly get swollen. The condition can make it hard to breathe. It can also cause these symptoms:  A cough.  Coughing up clear, yellow, or green mucus.  Wheezing.  Chest congestion.  Shortness of breath.  A fever.  Body aches.  Chills.  A sore throat. Follow these instructions at home: Medicines  Take over-the-counter and prescription medicines only as told by your doctor.  If you were prescribed an antibiotic medicine, take it as told by your doctor. Do not stop taking the antibiotic even if you start to feel better. General instructions  Rest.  Drink enough fluids to keep your pee (urine) clear or pale yellow.  Avoid smoking and secondhand smoke. If you smoke and you need help quitting, ask your doctor. Quitting will help your lungs heal faster.  Use an inhaler, cool mist vaporizer, or humidifier as told by your doctor.  Keep all follow-up visits as told by your doctor. This is important. How is this prevented? To lower your risk of getting this condition again:  Wash your hands often with soap and water. If you cannot use soap and water, use hand sanitizer.  Avoid contact with people who have cold symptoms.  Try not to touch your hands to your mouth, nose, or eyes.  Make sure to get the flu shot every year. Contact a doctor if:  Your symptoms do not get better in 2 weeks. Get help right away if:  You cough up blood.  You have chest pain.  You have very bad shortness of breath.  You become dehydrated.  You faint (pass out) or keep feeling like you are going to pass out.  You keep throwing up (vomiting).  You have a very bad headache.  Your fever or chills gets worse. This information is not intended to replace advice given to you by  your health care provider. Make sure you discuss any questions you have with your health care provider. Document Released: 09/10/2007 Document Revised: 10/31/2015 Document Reviewed: 09/12/2015 Elsevier Interactive Patient Education  2017 [employer]

## 2016-05-15 NOTE — Progress Notes (Signed)
Subjective:    Patient ID: [name], male    DOB: [birth year], 51 y.o.   MRN: [id number]  HPI  [name] is a 51 year old male who presents today with 4 days of chills, and fever, and sinus pressure/pain.  Associated productive cough with yellow sputum, rhinitis with yellow drainage, nausea, loose stool x 1. He denies myalgias, SOB, sore throat, and ear pain. Treatment with ibuprofen and tylenol has provided limited benefit. History of asthma with intermittent wheezing per patient report. Use of albuterol TID for 3 days which has provided moderate benefit.   History of diabetes. He monitors his blood sugar and reports fasting this morning of 140 but reports this has just been since he has been sick. Fasting averages are WNL per patient. 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 He is not a smoker.  Review of Systems  Constitutional: Positive for chills and fever.  HENT: Positive for congestion, rhinorrhea, sinus pain and sinus pressure. Negative for ear pain and sore throat.   Respiratory: Positive for cough and wheezing. Negative for shortness of breath.   Cardiovascular: Negative for chest pain and palpitations.  Gastrointestinal: Negative for abdominal pain, diarrhea, nausea and vomiting.  Musculoskeletal: Negative for myalgias.  Skin: Negative for rash.  Neurological: Negative for dizziness.   Past Medical History:  Diagnosis Date  . Asthma   . Diarrhea   . Giardiasis    resolved  . Hyperlipidemia   . Hypertension   . Obstructive sleep apnea    sleep study 11/20/95, no apneas, ?obstructive hypopneas?     Social History   Social History  . Marital status: Married    Spouse name: N/A  . Number of children: N/A  . Years of education: N/A   Occupational History  . Not on file.   Social History Main Topics  . Smoking status: Never Smoker  . Smokeless tobacco: Never Used  . Alcohol use Yes  .  Drug use: No  . Sexual activity: Not on file   Other Topics Concern  . Not on file   Social History Narrative   Regular Exercise - YES    Past Surgical History:  Procedure Laterality Date  . ENT surgery  1998    Family History  Problem Relation Age of Onset  . Colon cancer Father   . Cancer Father 65    colon  . Heart disease Sister 58    MI    No Known Allergies  Current Outpatient Prescriptions on File Prior to Visit  Medication Sig Dispense Refill  . albuterol (PROVENTIL HFA) 108 (90 BASE) MCG/ACT inhaler Inhale 1 puff into the lungs as needed. For wheezing or shortness of breath     . Blood Glucose Monitoring Suppl (TRUE METRIX METER) w/Device KIT Use as directed. 1 kit 0  . ciprofloxacin-dexamethasone (CIPRODEX) otic suspension Place 4 drops into the right ear 2 (two) times daily. 7.5 mL 1  . EPINEPHrine (EPIPEN) 0.3 mg/0.3 mL IJ SOAJ injection Inject 0.3 mLs (0.3 mg total) into the muscle once. 1 Device 1  . fluticasone (FLONASE) 50 MCG/ACT nasal spray PLACE 2 SPRAYS INTO BOTH NOSTRILS DAILY. 48 g 1  . glucose blood (TRUE METRIX BLOOD GLUCOSE TEST) test strip Check blood sugars once in the morning fasting and once before dinner. 100 each 5  . ibuprofen (ADVIL,MOTRIN) 600 MG tablet Take 600 mg by mouth once.    Marland Kitchen LANCETS ULTRA FINE  MISC Check blood sugars once in the AM Fasting and once in the evening before dinner. 100 each 5  . lisinopril (PRINIVIL,ZESTRIL) 10 MG tablet Take 1 tablet (10 mg total) by mouth daily. 90 tablet 1  . metFORMIN (GLUCOPHAGE) 1000 MG tablet Take 1 tablet (1,000 mg total) by mouth 2 (two) times daily with a meal. 180 tablet 3  . methocarbamol (ROBAXIN) 500 MG tablet Take 1 tablet (500 mg total) by mouth every 6 (six) hours as needed for muscle spasms. 30 tablet 1  . omeprazole (PRILOSEC) 20 MG capsule Take 1 capsule (20 mg total) by mouth daily. 90 capsule 1   No current facility-administered medications on file prior to visit.     BP (!)  120/96 (BP Location: Right Arm, Patient Position: Sitting, Cuff Size: Normal)   Pulse 84   Temp 98.3 F (36.8 C) (Oral)   Wt 235 lb 12.8 oz (107 kg)   SpO2 98%   BMI 32.89 kg/m       Objective:   Physical Exam  Constitutional: He is oriented to person, place, and time. He appears well-developed and well-nourished.  HENT:  Left Ear: Tympanic membrane normal.  Nose: Rhinorrhea present. Right sinus exhibits frontal sinus tenderness. Right sinus exhibits no maxillary sinus tenderness. Left sinus exhibits frontal sinus tenderness. Left sinus exhibits no maxillary sinus tenderness.  Mouth/Throat: Oropharynx is clear and moist and mucous membranes are normal. No posterior oropharyngeal edema or posterior oropharyngeal erythema.  Eyes: Pupils are equal, round, and reactive to light. No scleral icterus.  Neck: Neck supple.  Cardiovascular: Normal rate and regular rhythm.   Pulmonary/Chest: Effort normal and breath sounds normal. He has no wheezes.  Lymphadenopathy:    He has cervical adenopathy.  Neurological: He is alert and oriented to person, place, and time.  Skin: Skin is warm and dry. No rash noted.        Assessment & Plan:  1. 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  Prednisone short term report of wheezing and increase in albuterol use. Advised patient to check blood sugars frequently and report any signs/symptoms of polyuria, polyphagia, or polydipsia.   - predniSONE (DELTASONE) 10 MG tablet; Take 4 tablets once daily for 2 days, 3 tabs daily for 2 days, 2 tabs daily for 2 days, 1 tab daily for 2 days.  Dispense: 20 tablet; Refill: 0 - doxycycline (VIBRA-TABS) 100 MG tablet; Take 1 tablet (100 mg total) by mouth 2 (two) times daily.  Dispense: 20 tablet; Refill: 0  2. Body aches POC influenza negative - POCT Influenza A  Jose Metz, FNP-C

## 2016-06-17 ENCOUNTER — Other Ambulatory Visit: Payer: Self-pay | Admitting: Family Medicine

## 2016-06-18 MED FILL — LISINOPRIL 10 MG TABLET: 10 | 90 days supply | Qty: 90 | Fill #0

## 2016-06-18 MED FILL — OMEPRAZOLE DR 20 MG CAPSULE: 20 | 90 days supply | Qty: 90 | Fill #0

## 2016-07-01 ENCOUNTER — Telehealth: Payer: Self-pay | Admitting: Family Medicine

## 2016-07-01 MED ORDER — GLUCOSE BLOOD VI STRP: 1.0000 | ORAL_STRIP | Freq: Two times a day (BID) | 12 refills | Status: DC

## 2016-07-01 MED ORDER — FREESTYLE LANCETS MISC: 1.0000 | Freq: Two times a day (BID) | 12 refills | Status: DC

## 2016-07-01 MED FILL — FREESTYLE LITE TEST STRIP: 90 days supply | Qty: 200 | Fill #0

## 2016-07-01 MED FILL — FREESTYLE LANCETS: 90 days supply | Qty: 200 | Fill #0

## 2016-07-01 NOTE — Telephone Encounter (Signed)
Pt needs new meter freestyle lite, lancets and test strips. [facility] pharm . Pt is checking his bs twice a day

## 2016-07-01 NOTE — Telephone Encounter (Signed)
Rx sent 

## 2016-07-03 MED FILL — FREESTYLE LITE METER: 30 days supply | Qty: 1 | Fill #0

## 2016-07-22 MED FILL — METHOCARBAMOL 500 MG TABLET: 500 | 7 days supply | Qty: 30 | Fill #1

## 2016-07-22 MED FILL — metFORMIN HCL 1000 MG TABS: 1000 | 90 days supply | Qty: 180 | Fill #2

## 2016-09-17 ENCOUNTER — Ambulatory Visit (INDEPENDENT_AMBULATORY_CARE_PROVIDER_SITE_OTHER): Payer: 59 | Admitting: Family Medicine

## 2016-09-17 ENCOUNTER — Encounter: Payer: Self-pay | Admitting: Family Medicine

## 2016-09-17 VITALS — BP 120/88 | Temp 98.6°F | Wt 238.4 lb

## 2016-09-17 DIAGNOSIS — E119 Type 2 diabetes mellitus without complications: Secondary | ICD-10-CM

## 2016-09-17 DIAGNOSIS — K219 Gastro-esophageal reflux disease without esophagitis: Secondary | ICD-10-CM | POA: Diagnosis not present

## 2016-09-17 DIAGNOSIS — Z7184 Encounter for health counseling related to travel: Secondary | ICD-10-CM

## 2016-09-17 DIAGNOSIS — I10 Essential (primary) hypertension: Secondary | ICD-10-CM | POA: Diagnosis not present

## 2016-09-17 DIAGNOSIS — Z7189 Other specified counseling: Secondary | ICD-10-CM | POA: Diagnosis not present

## 2016-09-17 LAB — POCT GLYCOSYLATED HEMOGLOBIN (HGB A1C): HEMOGLOBIN A1C: 6

## 2016-09-17 MED ORDER — OMEPRAZOLE 20 MG PO CPDR: 20.0000 mg | DELAYED_RELEASE_CAPSULE | Freq: Every day | ORAL | 3 refills | Status: DC

## 2016-09-17 MED ORDER — CIPROFLOXACIN HCL 500 MG PO TABS: 500.0000 mg | ORAL_TABLET | Freq: Two times a day (BID) | ORAL | 0 refills | Status: DC

## 2016-09-17 MED ORDER — METFORMIN HCL 500 MG PO TABS: 500.0000 mg | ORAL_TABLET | Freq: Two times a day (BID) | ORAL | 3 refills | Status: DC

## 2016-09-17 MED ORDER — PREDNISONE 10 MG PO TABS: ORAL_TABLET | ORAL | 0 refills | Status: DC

## 2016-09-17 MED ORDER — LISINOPRIL 10 MG PO TABS: 10.0000 mg | ORAL_TABLET | Freq: Every day | ORAL | 3 refills | Status: DC

## 2016-09-17 MED FILL — LISINOPRIL 10 MG TABLET: 10 | 90 days supply | Qty: 90 | Fill #0

## 2016-09-17 MED FILL — CIPROFLOXACIN HCL 500 MG TA: 500 | 6 days supply | Qty: 12 | Fill #0

## 2016-09-17 MED FILL — predniSONE 10 MG TABS: 10 | 6 days supply | Qty: 21 | Fill #0

## 2016-09-17 MED FILL — OMEPRAZOLE DR 20 MG CAPSULE: 20 | 90 days supply | Qty: 90 | Fill #0

## 2016-09-17 NOTE — Progress Notes (Signed)
Subjective:     Patient ID: [name], male   DOB: [birth year], 51 y.o.   MRN: [id number]  HPI Patient here to discuss several things  Type 2 diabetes. Diagnosed last fall. A1c 12.7%. Currently on metformin 1000 mg twice daily. Blood sugars consistently less than 130 fasting. Feels better over all. No polyuria or polydipsia. Exercising about 3 days per week. He is made some dietary changes. Recent eye exam reportedly normal  History of GERD controlled with omeprazole. Requesting refills. Hypertension controlled with lisinopril and requesting refills.  No recent chest pains. No recent asthma exacerbations. Patient has scheduled trip to [place] coming up soon. He is requesting a prescription for Cipro and also prednisone taper in the event he has asthma flare.  Past Medical History:  Diagnosis Date  . Asthma   . Diarrhea   . Giardiasis    resolved  . Hyperlipidemia   . Hypertension   . Obstructive sleep apnea    sleep study 11/20/95, no apneas, ?obstructive hypopneas?   Past Surgical History:  Procedure Laterality Date  . ENT surgery  1998    reports that he has never smoked. He has never used smokeless tobacco. He reports that he drinks alcohol. He reports that he does not use drugs. family history includes Cancer (age of onset: 2259) in his father; Colon cancer in his father; Heart disease (age of onset: 8647) in his sister. No Known Allergies   Review of Systems  Constitutional: Negative for fatigue and unexpected weight change.  Eyes: Negative for visual disturbance.  Respiratory: Negative for cough, chest tightness and shortness of breath.   Cardiovascular: Negative for chest pain, palpitations and leg swelling.  Gastrointestinal: Negative for abdominal pain.  Endocrine: Negative for polydipsia and polyuria.  Neurological: Negative for dizziness, syncope, weakness, light-headedness and headaches.       Objective:   Physical Exam  Constitutional: He is oriented to  person, place, and time. He appears well-developed and well-nourished.  HENT:  Right Ear: External ear normal.  Left Ear: External ear normal.  Mouth/Throat: Oropharynx is clear and moist.  Eyes: Pupils are equal, round, and reactive to light.  Neck: Neck supple. No thyromegaly present.  Cardiovascular: Normal rate and regular rhythm.   Pulmonary/Chest: Effort normal and breath sounds normal. No respiratory distress. He has no wheezes. He has no rales.  Musculoskeletal: He exhibits no edema.  Neurological: He is alert and oriented to person, place, and time.       Assessment:     #1 type 2 diabetes greatly improved with A1c today 6.0%  #2 hypertension stable and at goal  #3 GERD stable on omeprazole  #4 travel advice encounter    Plan:     -Patient requesting reduction in metformin will try 500 mg twice daily and recheck A1c in 4 months -Encouraged to continue with weight loss efforts -Refilled multiple medications including metformin, lisinopril, omeprazole -Cipro and prednisone prescriptions provided for upcoming travel to use as needed  Jose CoveyBruce W Doyel Mulkern MD [facility]

## 2016-09-18 MED FILL — metFORMIN HCL 500 MG TABS: 500 | 90 days supply | Qty: 180 | Fill #0

## 2016-12-17 MED FILL — LISINOPRIL 10 MG TABLET: 10 | 90 days supply | Qty: 90 | Fill #1

## 2016-12-17 MED FILL — metFORMIN HCL 500 MG TABS: 500 | 90 days supply | Qty: 180 | Fill #1

## 2016-12-17 MED FILL — OMEPRAZOLE 20 MG CAP: 20 | 90 days supply | Qty: 90 | Fill #1

## 2016-12-17 MED FILL — FREESTYLE LITE TEST STRIP: 90 days supply | Qty: 200 | Fill #1

## 2016-12-17 MED FILL — FREESTYLE LANCETS: 90 days supply | Qty: 200 | Fill #1

## 2016-12-25 ENCOUNTER — Encounter: Payer: Self-pay | Admitting: Family Medicine

## 2017-03-16 MED FILL — metFORMIN HCL 500 MG TABS: 500 | 90 days supply | Qty: 180 | Fill #2

## 2017-03-16 MED FILL — OMEPRAZOLE 20 MG CAP: 20 | 90 days supply | Qty: 90 | Fill #2

## 2017-03-16 MED FILL — LISINOPRIL 10 MG TABS: 10 | 90 days supply | Qty: 90 | Fill #2

## 2017-03-16 MED FILL — FREESTYLE LITE TEST STRIP: 90 days supply | Qty: 200 | Fill #2

## 2017-06-02 ENCOUNTER — Telehealth: Payer: Self-pay | Admitting: Family Medicine

## 2017-06-02 NOTE — Telephone Encounter (Signed)
Copied from CRM [phone]. Topic: Quick Communication - Rx Refill/Question >> Jun 02, 2017 11:03 AM Raquel SarnaHayes, Teresa G wrote: Lisinopril 10 mg Omeprazole 20 mg Metformin 1000 mg  Requesting 3 month supply - Pt has new insurance and position.  Ne[facility] all Rx's [facility] [phone]

## 2017-06-03 ENCOUNTER — Other Ambulatory Visit: Payer: Self-pay

## 2017-06-03 MED ORDER — METFORMIN HCL 500 MG PO TABS: 500.0000 mg | ORAL_TABLET | Freq: Two times a day (BID) | ORAL | 0 refills | Status: DC

## 2017-06-03 MED ORDER — LISINOPRIL 10 MG PO TABS: 10.0000 mg | ORAL_TABLET | Freq: Every day | ORAL | 0 refills | Status: DC

## 2017-06-03 MED ORDER — OMEPRAZOLE 20 MG PO CPDR: 20.0000 mg | DELAYED_RELEASE_CAPSULE | Freq: Every day | ORAL | 0 refills | Status: DC

## 2017-06-03 NOTE — Telephone Encounter (Signed)
Pt. Given 30 day supply of requested medications - message left that he needs to schedule an appointment.

## 2017-06-09 ENCOUNTER — Encounter: Payer: Self-pay | Admitting: Family Medicine

## 2017-06-09 LAB — HM DIABETES EYE EXAM

## 2017-06-17 ENCOUNTER — Encounter: Payer: Self-pay | Admitting: Family Medicine

## 2017-06-17 ENCOUNTER — Ambulatory Visit (INDEPENDENT_AMBULATORY_CARE_PROVIDER_SITE_OTHER): Payer: PRIVATE HEALTH INSURANCE | Admitting: Family Medicine

## 2017-06-17 VITALS — BP 110/70 | HR 95 | Temp 98.5°F | Wt 246.4 lb

## 2017-06-17 DIAGNOSIS — E8881 Metabolic syndrome: Secondary | ICD-10-CM

## 2017-06-17 DIAGNOSIS — I10 Essential (primary) hypertension: Secondary | ICD-10-CM

## 2017-06-17 DIAGNOSIS — E119 Type 2 diabetes mellitus without complications: Secondary | ICD-10-CM

## 2017-06-17 LAB — POCT GLYCOSYLATED HEMOGLOBIN (HGB A1C): Hemoglobin A1C: 7.2

## 2017-06-17 MED ORDER — FLUTICASONE PROPIONATE 50 MCG/ACT NA SUSP: NASAL | 3 refills | Status: DC

## 2017-06-17 NOTE — Patient Instructions (Signed)
Lose some weight and get back to more regular exercise Let's plan on 3 month follow up and will repeat lipids and A1C then.

## 2017-06-17 NOTE — Progress Notes (Signed)
Subjective:     Patient ID: [name], male   DOB: 1965-12-14, 52 y.o.   MRN: [id number]  HPI Patient seen for follow-up regarding type 2 diabetes. Recent poor compliance with diet and exercise. He attributed most of his weight gain to increase portion sizes. He tends to try to eat lower carb choices. He is currently on metformin 500 mg one the morning and 2 at night. Fasting blood sugars recently run 120-140.  He has history of metabolic syndrome with high triglycerides and low HDL as well as hyperglycemia and hypertension. Hypertension treated with lisinopril. Currently not on statin therapy  Past Medical History:  Diagnosis Date  . Asthma   . Diarrhea   . Giardiasis    resolved  . Hyperlipidemia   . Hypertension   . Obstructive sleep apnea    sleep study 11/20/95, no apneas, ?obstructive hypopneas?   Past Surgical History:  Procedure Laterality Date  . ENT surgery  1998    reports that  has never smoked. he has never used smokeless tobacco. He reports that he drinks alcohol. He reports that he does not use drugs. family history includes Cancer (age of onset: 3059) in his father; Colon cancer in his father; Heart disease (age of onset: 7047) in his sister. No Known Allergies   Review of Systems  Constitutional: Negative for fatigue and unexpected weight change.  Eyes: Negative for visual disturbance.  Respiratory: Negative for cough, chest tightness and shortness of breath.   Cardiovascular: Negative for chest pain, palpitations and leg swelling.  Endocrine: Negative for polydipsia and polyuria.  Neurological: Negative for dizziness, syncope, weakness, light-headedness and headaches.       Objective:   Physical Exam  Constitutional: He appears well-developed and well-nourished.  Neck: Neck supple. No thyromegaly present.  Cardiovascular: Normal rate and regular rhythm.  Pulmonary/Chest: Effort normal and breath sounds normal. No respiratory distress. He has no wheezes.  He has no rales.  Musculoskeletal: He exhibits no edema.       Assessment:     #1 type 2 diabetes. Worsening control with A1c today 7.2%. This is up from 6.0% last visit several months ago. Recent poor compliance with diet and exercise.   #2 hypertension stable and at goal  #3 dyslipidemia      Plan:     -Needs follow-up lipids. He would like to wait 3 months and work on some weight loss and establishing more consistent exercise and plan fasting labs at follow-up -Get back to more diligent exercise and lose some weight   gave option of increasing metformin to two twice daily. He wants to keep current dosage same and reassess in 3 months -discussed possible statin use at follow-up after repeat lipids  Jose CoveyBruce W Burchette MD [facility]  -

## 2017-06-30 ENCOUNTER — Other Ambulatory Visit: Payer: Self-pay | Admitting: Family Medicine

## 2017-08-07 ENCOUNTER — Telehealth: Payer: Self-pay | Admitting: Family Medicine

## 2017-08-07 MED ORDER — LISINOPRIL 10 MG PO TABS: 10.0000 mg | ORAL_TABLET | Freq: Every day | ORAL | 0 refills | Status: DC

## 2017-08-07 MED ORDER — METFORMIN HCL 500 MG PO TABS: ORAL_TABLET | ORAL | 0 refills | Status: DC

## 2017-08-07 MED ORDER — OMEPRAZOLE 20 MG PO CPDR: 20.0000 mg | DELAYED_RELEASE_CAPSULE | Freq: Every day | ORAL | 0 refills | Status: DC

## 2017-08-07 NOTE — Telephone Encounter (Signed)
Pt is in the office today and would like the following Rx's sent in lisinopril 10 MG 90 days (out of this medication), omeprazole 20 MG 90 days and metformin s/b 3 times a day instead of 2 times a day 90 days  Pharm: [facility] [phone].

## 2017-08-07 NOTE — Telephone Encounter (Signed)
Rx sent 

## 2017-09-08 ENCOUNTER — Encounter: Payer: Self-pay | Admitting: Family Medicine

## 2017-09-08 ENCOUNTER — Ambulatory Visit (INDEPENDENT_AMBULATORY_CARE_PROVIDER_SITE_OTHER): Payer: PRIVATE HEALTH INSURANCE | Admitting: Family Medicine

## 2017-09-08 ENCOUNTER — Ambulatory Visit (INDEPENDENT_AMBULATORY_CARE_PROVIDER_SITE_OTHER): Payer: PRIVATE HEALTH INSURANCE

## 2017-09-08 VITALS — BP 120/84 | HR 88 | Temp 98.1°F | Wt 235.6 lb

## 2017-09-08 DIAGNOSIS — R059 Cough, unspecified: Secondary | ICD-10-CM

## 2017-09-08 DIAGNOSIS — R05 Cough: Secondary | ICD-10-CM | POA: Diagnosis not present

## 2017-09-08 DIAGNOSIS — J45901 Unspecified asthma with (acute) exacerbation: Secondary | ICD-10-CM | POA: Diagnosis not present

## 2017-09-08 MED ORDER — AMOXICILLIN-POT CLAVULANATE 875-125 MG PO TABS: 1.0000 | ORAL_TABLET | Freq: Two times a day (BID) | ORAL | 0 refills | Status: DC

## 2017-09-08 MED ORDER — METHYLPREDNISOLONE ACETATE 80 MG/ML IJ SUSP
80.0000 mg | Freq: Once | INTRAMUSCULAR | Status: AC
  Administered 2017-09-08: 80 mg via INTRAMUSCULAR

## 2017-09-08 NOTE — Patient Instructions (Signed)
[facility]  Follow up for any fever or increased shortness of breath.

## 2017-09-08 NOTE — Progress Notes (Signed)
Subjective:     Patient ID: [name], male   DOB: 02/27/1966, 52 y.o.   MRN: [id number]  HPI Patient seen with some ongoing respiratory symptoms. Onset about a week and half ago of some chest congestion and wheezing. He went to someone at his work and was placed on Zithromax and prednisone. He just finished prednisone several days ago and stopped Zithromax 2 days ago. He has not had any fevers. He is having some sweats. Has had some intermittent wheezing and has been using albuterol fairly frequently. No nausea or vomiting. He has history of asthma. Nonsmoker.  Past Medical History:  Diagnosis Date  . Asthma   . Diarrhea   . Giardiasis    resolved  . Hyperlipidemia   . Hypertension   . Obstructive sleep apnea    sleep study 11/20/95, no apneas, ?obstructive hypopneas?   Past Surgical History:  Procedure Laterality Date  . ENT surgery  1998    reports that he has never smoked. He has never used smokeless tobacco. He reports that he drinks alcohol. He reports that he does not use drugs. family history includes Cancer (age of onset: 76) in his father; Colon cancer in his father; Heart disease (age of onset: 39) in his sister. No Known Allergies   Review of Systems  Constitutional: Positive for fatigue. Negative for chills and fever.  HENT: Negative for sinus pressure and sinus pain.   Respiratory: Positive for cough, choking, shortness of breath and wheezing.   Cardiovascular: Negative for chest pain and leg swelling.  Gastrointestinal: Negative for nausea and vomiting.       Objective:   Physical Exam  Constitutional: He appears well-developed and well-nourished.  HENT:  Right Ear: External ear normal.  Left Ear: External ear normal.  Mouth/Throat: Oropharynx is clear and moist.  Neck: Neck supple.  Cardiovascular: Normal rate and regular rhythm.  Pulmonary/Chest: Effort normal and breath sounds normal. No respiratory distress.  Patient does have some coarse  rhonchi left lung base and some faint wheezes. No retractions. No respiratory distress.       Assessment:     Acute exacerbation of asthma. No documented fever but rule out left lower lobe pneumonia. Patient in no respiratory distress    Plan:     -Chest x-ray today -Start Augmentin 875 mg twice daily for 10 days -Patient was given second taper prednisone at work earlier but has not yet started this -Continue albuterol as needed -Depo-Medrol 80 mg IM given -Follow-up immediately for any increasing shortness of breath or other concerns  Kristian Covey MD [facility] at [facility]

## 2017-09-15 ENCOUNTER — Ambulatory Visit (INDEPENDENT_AMBULATORY_CARE_PROVIDER_SITE_OTHER): Payer: PRIVATE HEALTH INSURANCE | Admitting: Family Medicine

## 2017-09-15 ENCOUNTER — Encounter: Payer: Self-pay | Admitting: Family Medicine

## 2017-09-15 VITALS — BP 120/80 | HR 77 | Temp 98.0°F

## 2017-09-15 DIAGNOSIS — I10 Essential (primary) hypertension: Secondary | ICD-10-CM

## 2017-09-15 DIAGNOSIS — E119 Type 2 diabetes mellitus without complications: Secondary | ICD-10-CM

## 2017-09-15 DIAGNOSIS — E785 Hyperlipidemia, unspecified: Secondary | ICD-10-CM | POA: Diagnosis not present

## 2017-09-15 NOTE — Patient Instructions (Signed)
Set up labs for early July.

## 2017-09-15 NOTE — Progress Notes (Signed)
Subjective:     Patient ID: [name], male   DOB: 02/24/1966, 52 y.o.   MRN: [id number]  HPI Patient seen for the following issues  Recent cough respiratory illness. Improved after prednisone and second course of antibiotics. Cough resolved at this time. Feels much better overall  Type 2 diabetes. Last A1c had increased to 7.2%. Has make some diet changes as lost about 11 pounds. Did predictably have some increase in blood sugars recently with prednisone.  Hypertension treated with lisinopril. Blood pressure has been stable. No headaches or dizziness. He does have dyslipidemia by recent labs we plan to get fasting lipids. He would like to wait a few weeks to get follow-up labs because of his recent prednisone. No family history of known premature CAD  Past Medical History:  Diagnosis Date  . Asthma   . Diarrhea   . Giardiasis    resolved  . Hyperlipidemia   . Hypertension   . Obstructive sleep apnea    sleep study 11/20/95, no apneas, ?obstructive hypopneas?   Past Surgical History:  Procedure Laterality Date  . ENT surgery  1998    reports that he has never smoked. He has never used smokeless tobacco. He reports that he drinks alcohol. He reports that he does not use drugs. family history includes Cancer (age of onset: 78) in his father; Colon cancer in his father; Heart disease (age of onset: 42) in his sister. No Known Allergies   Review of Systems  Constitutional: Negative for fatigue.  Eyes: Negative for visual disturbance.  Respiratory: Negative for cough, chest tightness and shortness of breath.   Cardiovascular: Negative for chest pain, palpitations and leg swelling.  Endocrine: Negative for polydipsia and polyuria.  Neurological: Negative for dizziness, syncope, weakness, light-headedness and headaches.       Objective:   Physical Exam  Constitutional: He appears well-developed and well-nourished.  Cardiovascular: Normal rate and regular rhythm.   Pulmonary/Chest: Effort normal and breath sounds normal. No stridor. He has no wheezes. He has no rales.  Musculoskeletal: He exhibits no edema.  Skin:  Feet reveal no skin lesions. Good distal foot pulses. Good capillary refill. No calluses. Normal sensation with monofilament testing        Assessment:     #1 type 2 diabetes. History of fair control but improved with recent weight loss  #2 dyslipidemia. Patient currently not on statin  #3 hypertension stable and at goal    Plan:     -We wrote for future labs with lipid panel, hepatic panel, A1c in a few weeks -We discussed recommendations to consider statin use and type II diabetics. He would like to get lab work first [employer] plan follow-up interval depending on lab results in July  Kristian Covey MD [facility]

## 2017-10-05 ENCOUNTER — Other Ambulatory Visit: Payer: PRIVATE HEALTH INSURANCE

## 2017-10-09 ENCOUNTER — Other Ambulatory Visit (INDEPENDENT_AMBULATORY_CARE_PROVIDER_SITE_OTHER): Payer: PRIVATE HEALTH INSURANCE

## 2017-10-09 DIAGNOSIS — E785 Hyperlipidemia, unspecified: Secondary | ICD-10-CM | POA: Diagnosis not present

## 2017-10-09 DIAGNOSIS — E119 Type 2 diabetes mellitus without complications: Secondary | ICD-10-CM | POA: Diagnosis not present

## 2017-10-09 LAB — HEPATIC FUNCTION PANEL
ALBUMIN: 4.6 g/dL (ref 3.5–5.2)
ALT: 30 U/L (ref 0–53)
AST: 16 U/L (ref 0–37)
Alkaline Phosphatase: 62 U/L (ref 39–117)
BILIRUBIN TOTAL: 0.3 mg/dL (ref 0.2–1.2)
Bilirubin, Direct: 0 mg/dL (ref 0.0–0.3)
TOTAL PROTEIN: 7 g/dL (ref 6.0–8.3)

## 2017-10-09 LAB — HEMOGLOBIN A1C: Hgb A1c MFr Bld: 6.7 % — ABNORMAL HIGH (ref 4.6–6.5)

## 2017-10-09 LAB — LDL CHOLESTEROL, DIRECT: LDL DIRECT: 117 mg/dL

## 2017-10-09 LAB — LIPID PANEL
Cholesterol: 237 mg/dL — ABNORMAL HIGH (ref 0–200)
HDL: 36.1 mg/dL — AB (ref >39.00)
Total CHOL/HDL Ratio: 7
Triglycerides: 436 mg/dL — ABNORMAL HIGH (ref 0.0–149.0)

## 2017-10-21 ENCOUNTER — Telehealth: Payer: Self-pay | Admitting: *Deleted

## 2017-10-21 ENCOUNTER — Encounter: Payer: Self-pay | Admitting: Family Medicine

## 2017-10-21 LAB — HM COLONOSCOPY

## 2017-10-21 NOTE — Telephone Encounter (Signed)
Copied from CRM [phone]. Topic: Quick Communication - See Telephone Encounter >> Oct 21, 2017  4:08 PM Herby AbrahamJohnson, Shiquita C wrote: CRM for notification. See Telephone encounter for: 10/21/17.   Pt called in to be advised. Pt says that he received his lab results and was advised that a few things were abnormal. Pt would like to be advised on what is the providers plans for his care going forward.  Please advise.

## 2017-10-23 ENCOUNTER — Telehealth: Payer: Self-pay | Admitting: Family Medicine

## 2017-10-23 NOTE — Telephone Encounter (Signed)
Pt given results per notes of Dr Caryl NeverBurchette on 10/11/17. Unable to document in result note due to result note not being routed to [facility].  Pt asked if he could be ordered fibrozil if ok with Dr Caryl NeverBurchette. Pt [facility]

## 2017-10-23 NOTE — Telephone Encounter (Signed)
Left message on machine for patient to review lab results.  CRM created

## 2017-10-26 NOTE — Telephone Encounter (Signed)
Fibrozil?  Will need to call pt to get correct spelling for the medication he is requesting.

## 2017-10-26 NOTE — Telephone Encounter (Signed)
Called pt and left a VM to call back. CRM created and sent to [facility] pool. 

## 2017-10-27 ENCOUNTER — Telehealth: Payer: Self-pay | Admitting: Family Medicine

## 2017-10-27 DIAGNOSIS — E785 Hyperlipidemia, unspecified: Secondary | ICD-10-CM

## 2017-10-27 MED ORDER — ROSUVASTATIN CALCIUM 20 MG PO TABS: 20.0000 mg | ORAL_TABLET | Freq: Every day | ORAL | 0 refills | Status: DC

## 2017-10-27 NOTE — Telephone Encounter (Signed)
Rx and lab orders placed

## 2017-10-27 NOTE — Telephone Encounter (Signed)
I called and left message with him yesterday.  Given that he has type 2 diabetes and triglycerides < 500, my recommendation would be for statin rather than Gemfibrozil at this time.  I think he may have had some reservations about statins- but I feel they would add more for prevention than fibric acid derivatives.

## 2017-10-27 NOTE — Telephone Encounter (Signed)
Copied from CRM [phone]. Topic: Quick Communication - See Telephone Encounter >> Oct 26, 2017  3:59 PM Gracelyn NurseBlackwell, Shelby P, [place] wrote: CRM for notification. See Telephone encounter for: 10/26/17. >> Oct 26, 2017  4:31 PM Floria RavelingStovall, Shana A wrote: Pt called and stated the Dr Caryl Neverburchette let him a very extensive message and he wanted to let him know that the statin Is fine or whatever Dr Caryl NeverBurchette thought was best for him.  He stated [facility]    Best number [phone] Pharmacy[facility]

## 2017-10-27 NOTE — Telephone Encounter (Signed)
completed

## 2017-11-03 ENCOUNTER — Other Ambulatory Visit: Payer: Self-pay | Admitting: Family Medicine

## 2017-11-16 ENCOUNTER — Ambulatory Visit: Payer: Self-pay | Admitting: Family Medicine

## 2017-11-16 NOTE — Telephone Encounter (Signed)
Pt stated that he has stopped taking the Crestor due to moderate to severe fatigue and muscle aches. Pt stated that he began the Crestor on Monday and by this past Saturday his symptoms began.  Reason for Disposition . Caller has NON-URGENT medication question about med that PCP prescribed and triager unable to answer question  Answer Assessment - Initial Assessment Questions 1. SYMPTOMS: "Do you have any symptoms?"     Pt stated he began Crestor Monday August 5th. On Saturday August 10th pt stated he felt like "he was hit by a truck." Pt stated he stopped taking the Crestor Saturday and has since felt better. Pt called to notify that he has stopped taking the Crestor.  2. SEVERITY: If symptoms are present, ask "Are they mild, moderate or severe?"     Moderate to severe.  Protocols used: MEDICATION QUESTION CALL-A-AH

## 2017-11-20 ENCOUNTER — Other Ambulatory Visit: Payer: Self-pay | Admitting: Family Medicine

## 2018-04-30 ENCOUNTER — Other Ambulatory Visit: Payer: Self-pay | Admitting: Family Medicine

## 2018-05-25 ENCOUNTER — Other Ambulatory Visit: Payer: Self-pay | Admitting: Family Medicine

## 2018-06-28 ENCOUNTER — Other Ambulatory Visit: Payer: Self-pay | Admitting: Family Medicine

## 2018-06-30 ENCOUNTER — Other Ambulatory Visit: Payer: Self-pay | Admitting: Family Medicine

## 2018-07-30 ENCOUNTER — Ambulatory Visit (INDEPENDENT_AMBULATORY_CARE_PROVIDER_SITE_OTHER): Payer: PRIVATE HEALTH INSURANCE | Admitting: Family Medicine

## 2018-07-30 ENCOUNTER — Other Ambulatory Visit: Payer: Self-pay

## 2018-07-30 DIAGNOSIS — E119 Type 2 diabetes mellitus without complications: Secondary | ICD-10-CM

## 2018-07-30 DIAGNOSIS — I10 Essential (primary) hypertension: Secondary | ICD-10-CM

## 2018-07-30 DIAGNOSIS — K219 Gastro-esophageal reflux disease without esophagitis: Secondary | ICD-10-CM | POA: Diagnosis not present

## 2018-07-30 DIAGNOSIS — E785 Hyperlipidemia, unspecified: Secondary | ICD-10-CM | POA: Diagnosis not present

## 2018-07-30 DIAGNOSIS — H6690 Otitis media, unspecified, unspecified ear: Secondary | ICD-10-CM

## 2018-07-30 MED ORDER — OMEPRAZOLE 20 MG PO CPDR: DELAYED_RELEASE_CAPSULE | ORAL | 3 refills | Status: DC

## 2018-07-30 MED ORDER — LISINOPRIL 10 MG PO TABS: 10.0000 mg | ORAL_TABLET | Freq: Every day | ORAL | 3 refills | Status: DC

## 2018-07-30 MED ORDER — METFORMIN HCL 500 MG PO TABS: ORAL_TABLET | ORAL | 3 refills | Status: DC

## 2018-07-30 MED ORDER — CIPROFLOXACIN-DEXAMETHASONE 0.3-0.1 % OT SUSP: 4.0000 [drp] | Freq: Two times a day (BID) | OTIC | 1 refills | Status: DC

## 2018-07-30 NOTE — Progress Notes (Signed)
Patient ID: [name], male   DOB: Sep 11, 1965, 53 y.o.   MRN: [id number]  This visit type was conducted due to national recommendations for restrictions regarding the COVID-19 pandemic in an effort to limit this patient's exposure and mitigate transmission in our community.   Virtual Visit via Video Note  I connected with [name] on 07/30/18 at  2:15 PM EDT by a video enabled telemedicine application and verified that I am speaking with the correct person using two identifiers.  Location patient: home Location provider:work or home office Persons participating in the virtual visit: patient, provider  I discussed the limitations of evaluation and management by telemedicine and the availability of in person appointments. The patient expressed understanding and agreed to proceed.   HPI:  Patient has history of hypertension, GERD, type 2 diabetes, dyslipidemia, metabolic syndrome.  He needs refills of several medications.  He was on Crestor for hyperlipidemia but had severe myalgias.  He has not tried any other statins.  He is reluctant to try any others at this time.  Denies any recent chest pains or other specific complaints  Compliant with other medications with no side effects.  His reflux symptoms are stable.  He has history of chronic otitis media and uses Ciprodex as needed for flareups and requesting refills.  Diabetes well controlled by home readings with fastings 90-130 generally.  Last A1c 6.7%  ROS: See pertinent positives and negatives per HPI.  Past Medical History:  Diagnosis Date  . Asthma   . Diarrhea   . Giardiasis    resolved  . Hyperlipidemia   . Hypertension   . Obstructive sleep apnea    sleep study 11/20/95, no apneas, ?obstructive hypopneas?    Past Surgical History:  Procedure Laterality Date  . ENT surgery  1998    Family History  Problem Relation Age of Onset  . Colon cancer Father   . Cancer Father 29       colon  . Heart disease  Sister 75       MI    SOCIAL HX: Non-smoker   Current Outpatient Medications:  .  albuterol (PROVENTIL HFA) 108 (90 BASE) MCG/ACT inhaler, Inhale 1 puff into the lungs as needed. For wheezing or shortness of breath , Disp: , Rfl:  .  Blood Glucose Monitoring Suppl (TRUE METRIX METER) w/Device KIT, Use as directed., Disp: 1 kit, Rfl: 0 .  ciprofloxacin-dexamethasone (CIPRODEX) OTIC suspension, Place 4 drops into the right ear 2 (two) times daily., Disp: 7.5 mL, Rfl: 1 .  EPINEPHrine (EPIPEN) 0.3 mg/0.3 mL IJ SOAJ injection, Inject 0.3 mLs (0.3 mg total) into the muscle once., Disp: 1 Device, Rfl: 1 .  fluticasone (FLONASE) 50 MCG/ACT nasal spray, PLACE 2 SPRAYS INTO BOTH NOSTRILS DAILY., Disp: 48 g, Rfl: 3 .  glucose blood (FREESTYLE LITE) test strip, 1 each by Other route 2 (two) times daily., Disp: 200 each, Rfl: 12 .  ibuprofen (ADVIL,MOTRIN) 600 MG tablet, Take 600 mg by mouth once., Disp: , Rfl:  .  Lancets (FREESTYLE) lancets, 1 each by Other route 2 (two) times daily. E11.9, Disp: 200 each, Rfl: 12 .  lisinopril (ZESTRIL) 10 MG tablet, Take 1 tablet (10 mg total) by mouth daily., Disp: 90 tablet, Rfl: 3 .  metFORMIN (GLUCOPHAGE) 500 MG tablet, TAKE 1 TABLET BY MOUTH IN THE MORNING AND 2 TABLETS IN THE EVENING. PLEASE SCHEDULE A FOLLOW UP APPT. FOR MORE REFILLS., Disp: 270 tablet, Rfl: 3 .  omeprazole (PRILOSEC) 20 MG  capsule, TAKE 1 CAPSULE BY MOUTH DAILY., Disp: 90 capsule, Rfl: 3  EXAM:  VITALS per patient if applicable:  GENERAL: alert, oriented, appears well and in no acute distress  HEENT: atraumatic, conjunttiva clear, no obvious abnormalities on inspection of external nose and ears  NECK: normal movements of the head and neck  LUNGS: on inspection no signs of respiratory distress, breathing rate appears normal, no obvious gross SOB, gasping or wheezing  CV: no obvious cyanosis  MS: moves all visible extremities without noticeable abnormality  PSYCH/NEURO: pleasant and  cooperative, no obvious depression or anxiety, speech and thought processing grossly intact  ASSESSMENT AND PLAN:  Discussed the following assessment and plan:  #1 type 2 diabetes.  History of good control -Refill metformin -Patient will plan office follow-up hopefully within a few months  #2 dyslipidemia.  Intolerance with Crestor with myalgias. -We discussed other options.  Given current circumstances we have elected to wait not start another statin right now but after things clear up would consider trial of another statin  #3 hypertension-stable by home readings -Refill lisinopril for 1 year  #4 GERD stable on PPI -Refilled Prilosec for 1 year  #5 chronic otitis media -Refill Ciprodex for as needed use     I discussed the assessment and treatment plan with the patient. The patient was provided an opportunity to ask questions and all were answered. The patient agreed with the plan and demonstrated an understanding of the instructions.   The patient was advised to call back or seek an in-person evaluation if the symptoms worsen or if the condition fails to improve as anticipated   Carolann Littler, MD

## 2019-06-28 ENCOUNTER — Other Ambulatory Visit: Payer: Self-pay | Admitting: Family Medicine

## 2019-06-29 ENCOUNTER — Other Ambulatory Visit: Payer: Self-pay | Admitting: Family Medicine

## 2019-07-13 ENCOUNTER — Telehealth: Payer: Self-pay | Admitting: Family Medicine

## 2019-07-13 NOTE — Telephone Encounter (Signed)
Was this sent on the rigth pt. Pt hasnt been seen since 2020?

## 2019-07-13 NOTE — Telephone Encounter (Signed)
Pt is requesting his ultrasound results and his thyroid test result. Pt would like a call back. Thanks

## 2019-07-25 ENCOUNTER — Telehealth: Payer: Self-pay | Admitting: Family Medicine

## 2019-07-25 NOTE — Telephone Encounter (Signed)
Have not seen anything either

## 2019-07-25 NOTE — Telephone Encounter (Signed)
I have not seen anything regarding this- have you?

## 2019-07-25 NOTE — Telephone Encounter (Signed)
[facility] called to see if the PCP received the letter they mailed from Neurology about the pt's CIM last week. The pt called them and stated no one has follow up with him about his results. If received they would like for the PCP/CMA to call the pt with those results.    If not received, contact Jonathon Resides at [phone]

## 2019-07-25 NOTE — Telephone Encounter (Signed)
Please advise 

## 2019-07-26 NOTE — Telephone Encounter (Signed)
Placed on your desk. 

## 2019-07-27 MED ORDER — METFORMIN HCL 500 MG PO TABS: ORAL_TABLET | ORAL | 0 refills | Status: DC

## 2019-07-27 NOTE — Addendum Note (Signed)
Addended by: Raiford Simmonds R on: 07/27/2019 07:54 AM   Modules accepted: Orders

## 2019-07-27 NOTE — Telephone Encounter (Signed)
Medication has been sent in 

## 2019-07-27 NOTE — Telephone Encounter (Signed)
I reviewed results with pt.  Can you send in one refill for his metformin.  He is overdue for follow up and will be setting up office follow up.

## 2019-08-05 IMAGING — DX DG CHEST 2V
2 series · 2 of 2 positions shown · non-contrast
Comparison: 04/16/2005 chest radiograph

CLINICAL DATA: 51-year-old male with persistent cough.

EXAM:
CHEST - 2 VIEW

[chest pa]
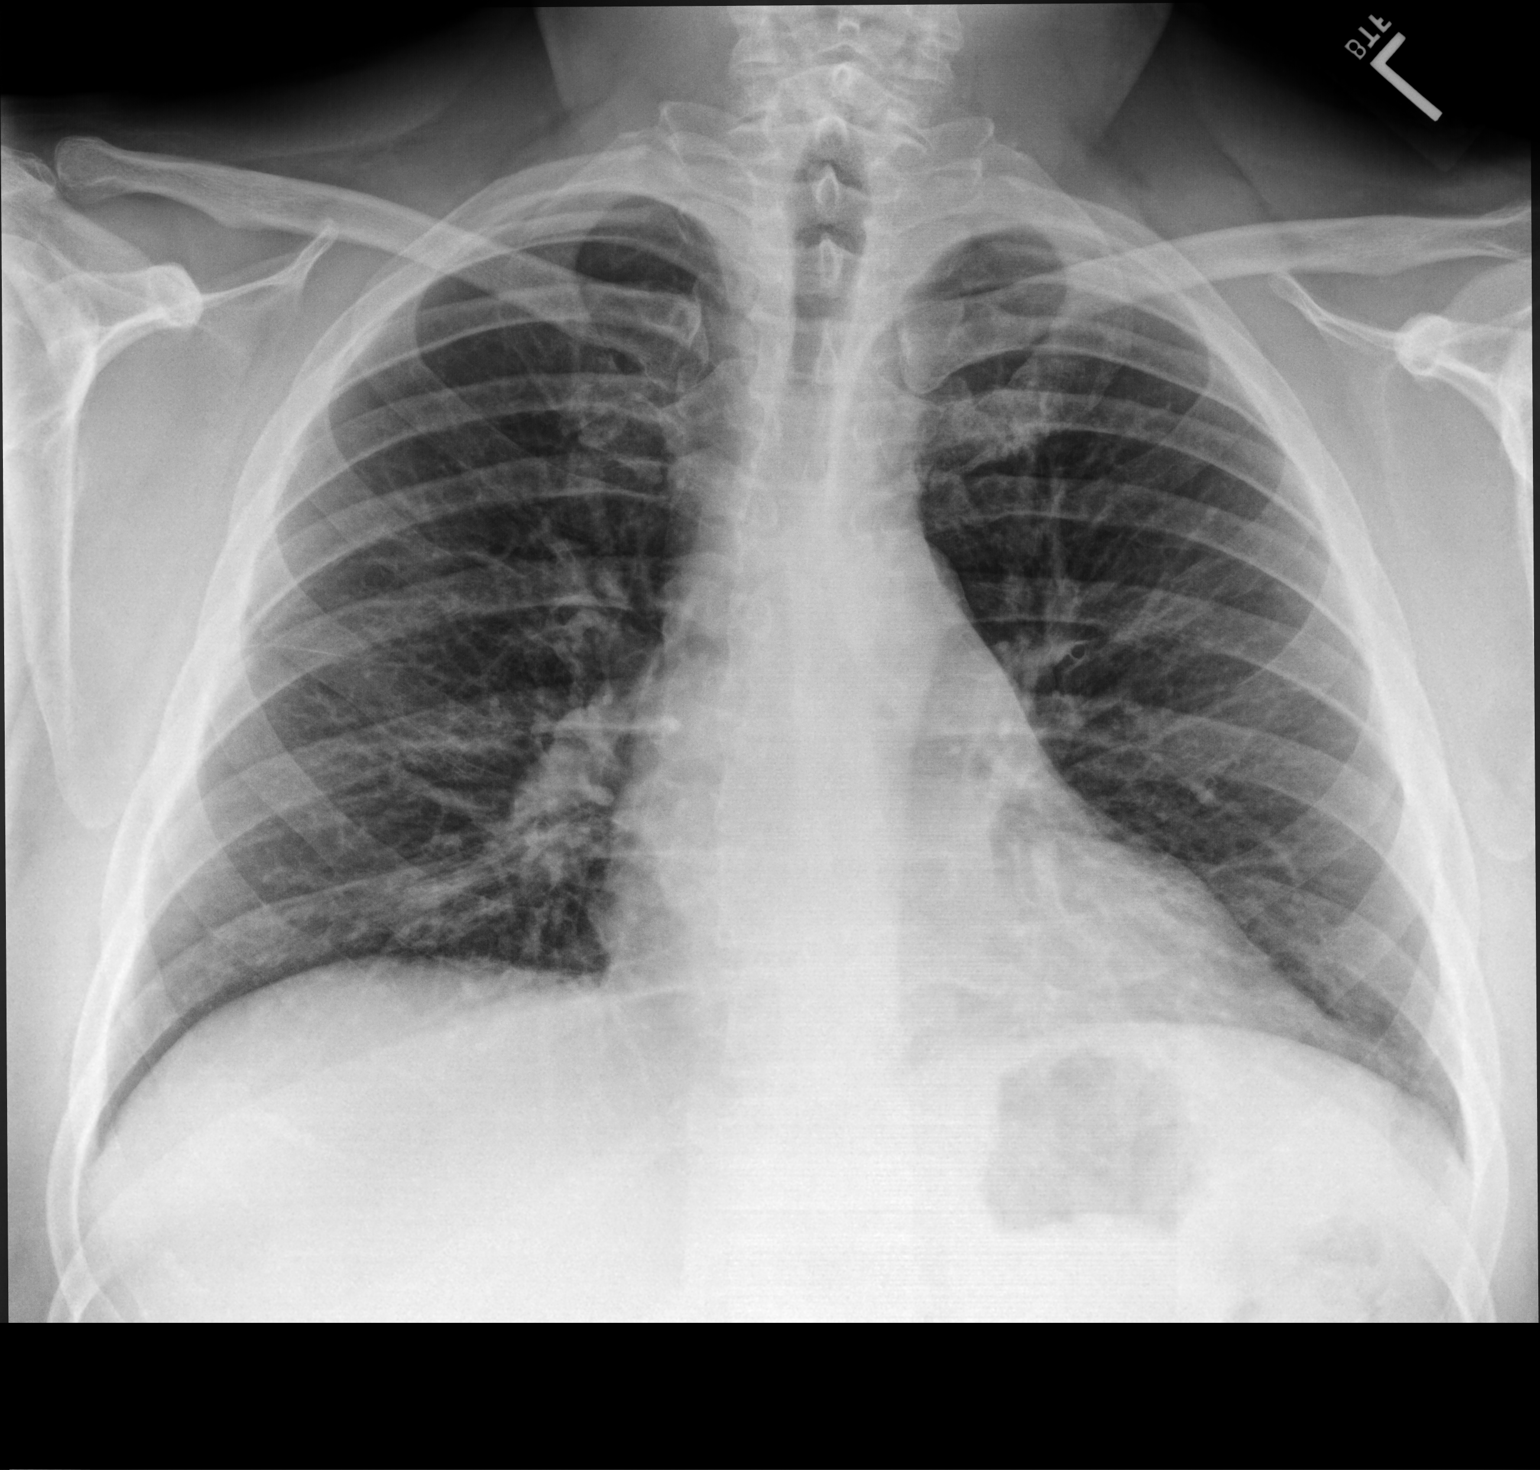

[chest lat]
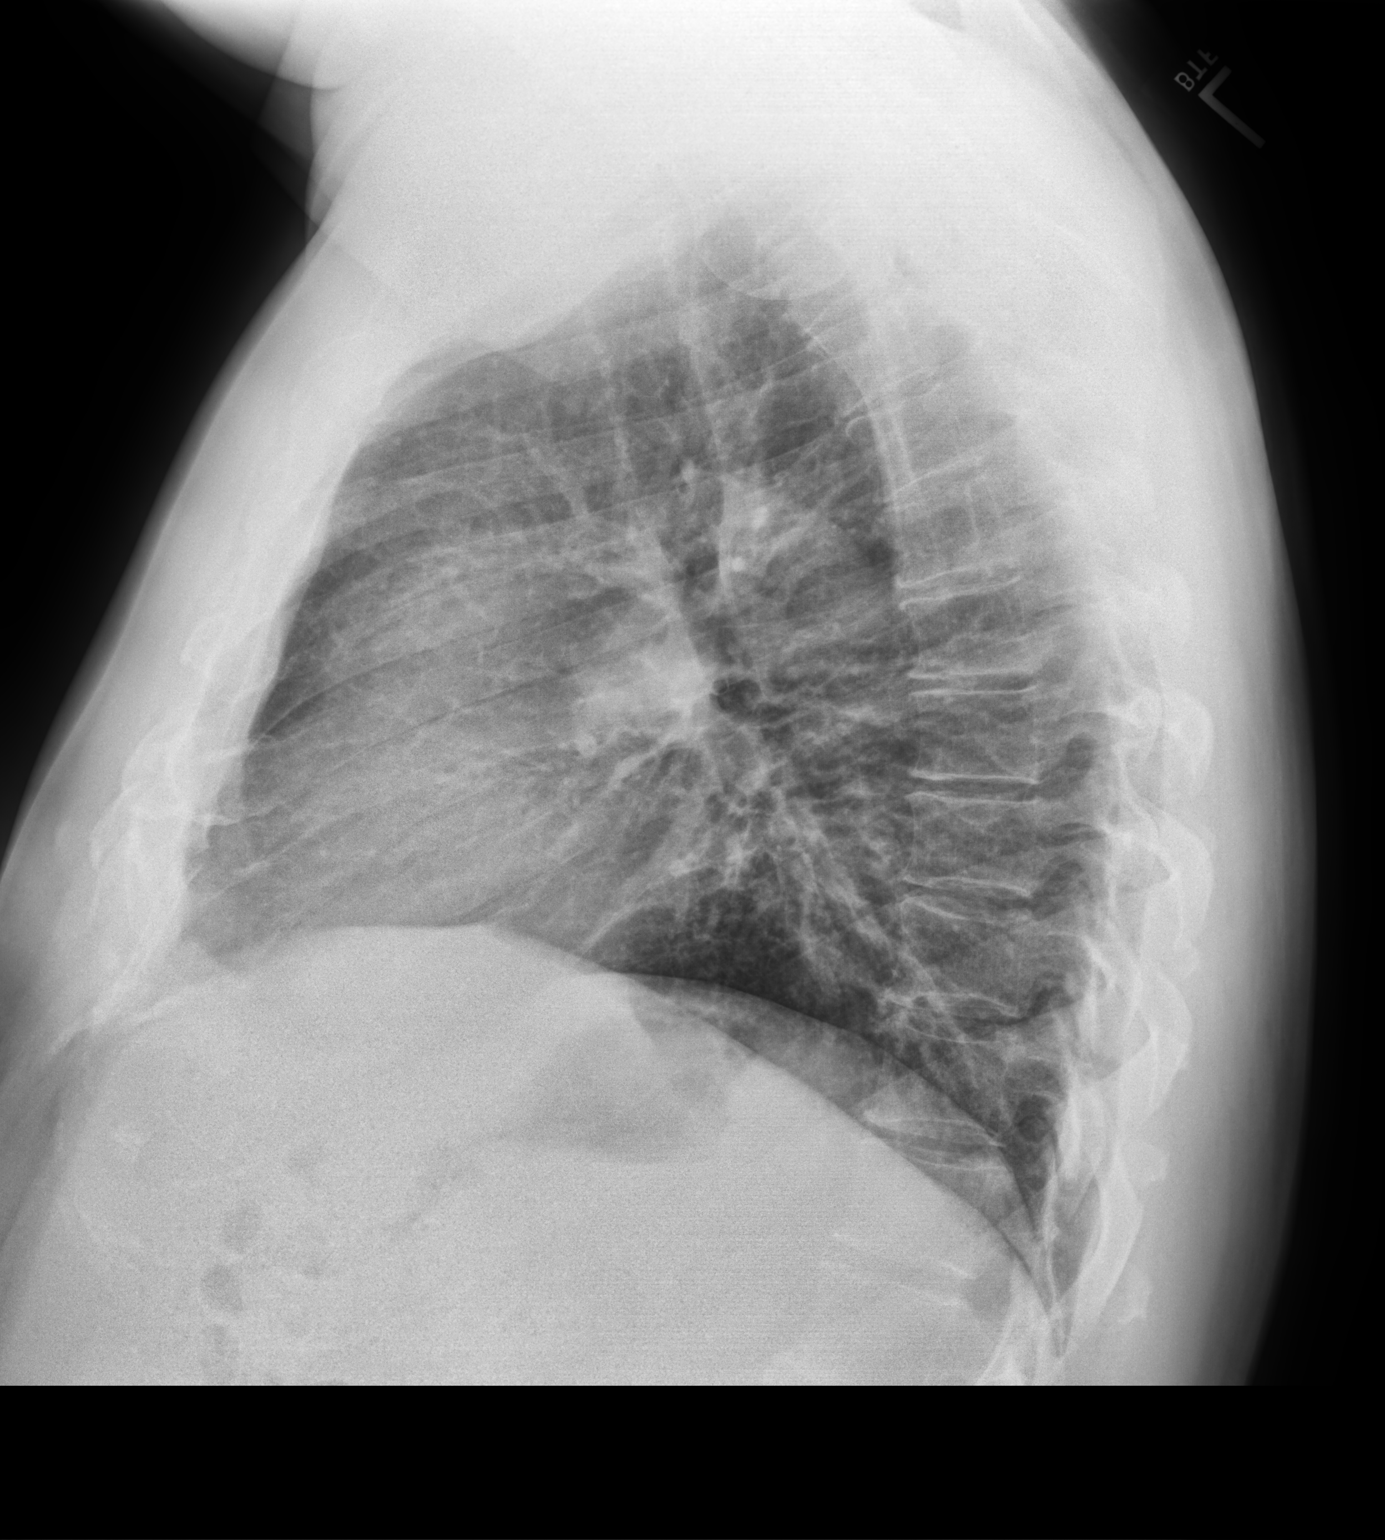

[2 of 2 positions shown; findings below may reference images not displayed]

FINDINGS: The cardiomediastinal silhouette is unremarkable.

Mild chronic peribronchial thickening is unchanged.

There is no evidence of focal airspace disease, pulmonary edema,
suspicious pulmonary nodule/mass, pleural effusion, or pneumothorax.

No acute bony abnormalities are identified.
IMPRESSION: 1. No evidence of acute cardiopulmonary disease
2. Mild chronic peribronchial thickening.

## 2019-08-15 ENCOUNTER — Other Ambulatory Visit: Payer: Self-pay

## 2019-08-16 ENCOUNTER — Ambulatory Visit (INDEPENDENT_AMBULATORY_CARE_PROVIDER_SITE_OTHER): Payer: PRIVATE HEALTH INSURANCE | Admitting: Family Medicine

## 2019-08-16 ENCOUNTER — Encounter: Payer: Self-pay | Admitting: Family Medicine

## 2019-08-16 VITALS — BP 124/68 | HR 84 | Temp 97.9°F | Wt 238.2 lb

## 2019-08-16 DIAGNOSIS — I10 Essential (primary) hypertension: Secondary | ICD-10-CM | POA: Diagnosis not present

## 2019-08-16 DIAGNOSIS — E119 Type 2 diabetes mellitus without complications: Secondary | ICD-10-CM

## 2019-08-16 DIAGNOSIS — E785 Hyperlipidemia, unspecified: Secondary | ICD-10-CM | POA: Diagnosis not present

## 2019-08-16 DIAGNOSIS — M25512 Pain in left shoulder: Secondary | ICD-10-CM | POA: Diagnosis not present

## 2019-08-16 DIAGNOSIS — Z23 Encounter for immunization: Secondary | ICD-10-CM | POA: Diagnosis not present

## 2019-08-16 LAB — POCT GLYCOSYLATED HEMOGLOBIN (HGB A1C): Hemoglobin A1C: 6.8 % — AB (ref 4.0–5.6)

## 2019-08-16 MED ORDER — METFORMIN HCL 500 MG PO TABS: ORAL_TABLET | ORAL | 3 refills | Status: DC

## 2019-08-16 MED ORDER — METHYLPREDNISOLONE ACETATE 40 MG/ML IJ SUSP
40.0000 mg | Freq: Once | INTRAMUSCULAR | Status: AC
  Administered 2019-08-16: 40 mg via INTRAMUSCULAR

## 2019-08-16 MED ORDER — LISINOPRIL 10 MG PO TABS: 10.0000 mg | ORAL_TABLET | Freq: Every day | ORAL | 3 refills | Status: DC

## 2019-08-16 MED ORDER — ATORVASTATIN CALCIUM 10 MG PO TABS: 10.0000 mg | ORAL_TABLET | Freq: Every day | ORAL | 5 refills | Status: DC

## 2019-08-16 NOTE — Progress Notes (Signed)
Subjective:     Patient ID: [name], male   DOB: November 24, 1965, 54 y.o.   MRN: [id number]  HPI Vada is here for medical follow-up.  He continues to work at [employer].  He has chronic problems of hypertension, type 2 diabetes, hyperlipidemia and metabolic syndrome.  He had carotid intima thickness testing through neurology department at [facility] recently and we had reviewed those results with him.  That apparently was done as part of the study.  He has hyperlipidemia.  We have placed him on Crestor but he had significant myalgias.  He was started at 20 mg dose.  Currently not on statin.  We discussed possible change to another statin as a trial.  Last A1c was 6 .7.  Not monitoring blood sugars regularly.  Remains on Metformin.  He states his compliance has not been very good during the past several months.  He is concerned this may be significantly elevated today.  He has new problem of left shoulder pain which has actually been going on for couple months.  Denies any specific injury.  He has pain with abduction.  Is starting to have more night pain.  Pain is moderate and occasionally severe at times.  No neck pain or any cervical radiculitis pain.  No numbness or weakness left upper extremity  Past Medical History:  Diagnosis Date  . Asthma   . Diarrhea   . Giardiasis    resolved  . Hyperlipidemia   . Hypertension   . Obstructive sleep apnea    sleep study 11/20/95, no apneas, ?obstructive hypopneas?   Past Surgical History:  Procedure Laterality Date  . ENT surgery  1998    reports that he has never smoked. He has never used smokeless tobacco. He reports current alcohol use. He reports that he does not use drugs. family history includes Cancer (age of onset: 19) in his father; Colon cancer in his father; Heart disease (age of onset: 75) in his sister. Allergies  Allergen Reactions  . Crestor [Rosuvastatin Calcium] Other (See Comments)    Muscle  aches     Review of Systems  Constitutional: Negative for fatigue and unexpected weight change.  Eyes: Negative for visual disturbance.  Respiratory: Negative for cough, chest tightness and shortness of breath.   Cardiovascular: Negative for chest pain, palpitations and leg swelling.  Endocrine: Negative for polydipsia and polyuria.  Genitourinary: Negative for dysuria.  Neurological: Negative for dizziness, syncope, weakness, light-headedness and headaches.       Objective:   Physical Exam Constitutional:      Appearance: He is well-developed.  HENT:     Right Ear: External ear normal.     Left Ear: External ear normal.  Eyes:     Pupils: Pupils are equal, round, and reactive to light.  Neck:     Thyroid: No thyromegaly.  Cardiovascular:     Rate and Rhythm: Normal rate and regular rhythm.  Pulmonary:     Effort: Pulmonary effort is normal. No respiratory distress.     Breath sounds: Normal breath sounds. No wheezing or rales.  Musculoskeletal:     Cervical back: Neck supple.     Comments: Left shoulder reveals no visible swelling.  He has fairly good range of motion but does have pain with abduction passively and against resistance.  There is no acromioclavicular tenderness.  No biceps tenderness.  No appreciable rotator cuff weakness  Neurological:     Mental Status: He is alert and  oriented to person, place, and time.        Assessment:     #1 type 2 diabetes reasonably well controlled with A1c today 6.8%  #2 hypertension stable and at goal  #3 dyslipidemia.  Previous intolerance with Crestor.  Recommend trial of Lipitor low-dose 10 mg once daily.  If tolerating well would consider repeat lipids and hepatic in 3 months  #4 progressive left shoulder pain past couple months in the absence of injury.  Suspect rotator cuff tendinitis    Plan:     -Start Lipitor 10 mg daily and recheck lipids and hepatic in 3 months  -He is encouraged to tighten up diet and get  back to more consistent exercise and 76-month follow-up.  - Discussed risks and benefits of corticosteroid injection left shoulder (including bruising, bleeding, and low risk of infection ) and patient consented.  After prepping skin with betadine, injected 40 mg depomedrol and 2 cc of plain xylocaine with 25 gauge one and one half inch needle using posterior lateral approach and pt tolerated well.  -Refilled Metformin and lisinopril for 1 year  -Patient also requesting shingles vaccine today and first vaccine was given right arm.  He knows to get follow-up in 2 to 6 months  Kristian Covey MD [facility] at [facility]

## 2019-08-16 NOTE — Patient Instructions (Signed)
Rotator Cuff Tendinitis  Rotator cuff tendinitis is inflammation of the tough, cord-like bands that connect muscle to bone (tendons) in the rotator cuff. The rotator cuff includes all of the muscles and tendons that connect the arm to the shoulder. The rotator cuff holds the head of the upper arm bone (humerus) in the cup (fossa) of the shoulder blade (scapula). This condition can lead to a long-lasting (chronic) tear. The tear may be partial or complete. What are the causes? This condition is usually caused by overusing the rotator cuff. What increases the risk? This condition is more likely to develop in athletes and workers who frequently use their shoulder or reach over their heads. This can include activities such as:  Tennis.  Baseball or softball.  Swimming.  Construction work.  Painting. What are the signs or symptoms? Symptoms of this condition include:  Pain spreading (radiating) from the shoulder to the upper arm.  Swelling and tenderness in front of the shoulder.  Pain when reaching, pulling, or lifting the arm above the head.  Pain when lowering the arm from above the head.  Minor pain in the shoulder when resting.  Increased pain in the shoulder at night.  Difficulty placing the arm behind the back. How is this diagnosed? This condition is diagnosed with a medical history and physical exam. Tests may also be done, including:  X-rays.  MRI.  Ultrasounds.  CT or MR arthrogram. During this test, a contrast material is injected and then images are taken. How is this treated? Treatment for this condition depends on the severity of the condition. In less severe cases, treatment may include:  Rest. This may be done with a sling that holds the shoulder still (immobilization). Your health care provider may also recommend avoiding activities that involve lifting your arm over your head.  Icing the shoulder.  Anti-inflammatory medicines, such as aspirin or  ibuprofen. In more severe cases, treatment may include:  Physical therapy.  Steroid injections.  Surgery. Follow these instructions at home: If you have a sling:  Wear the sling as told by your health care provider. Remove it only as told by your health care provider.  Loosen the sling if your fingers tingle, become numb, or turn cold and blue.  Keep the sling clean.  If the sling is not waterproof, do not let it get wet. Remove it, if allowed, or cover it with a watertight covering when you take a bath or shower. Managing pain, stiffness, and swelling  If directed, put ice on the injured area. ? If you have a removable sling, remove it as told by your health care provider. ? Put ice in a plastic bag. ? Place a towel between your skin and the bag. ? Leave the ice on for 20 minutes, 2-3 times a day.  Move your fingers often to avoid stiffness and to lessen swelling.  Raise (elevate) the injured area above the level of your heart while you are lying down.  Find a comfortable sleeping position or sleep on a recliner, if available. Driving  Do not drive or use heavy machinery while taking prescription pain medicine.  Ask your health care provider when it is safe to drive if you have a sling on your arm. Activity  Rest your shoulder as told by your health care provider.  Return to your normal activities as told by your health care provider. Ask your health care provider what activities are safe for you.  Do any exercises or stretches as   told by your health care provider.  If you do repetitive overhead tasks, take small breaks in between and include stretching exercises as told by your health care provider. General instructions  Do not use any products that contain nicotine or tobacco, such as cigarettes and e-cigarettes. These can delay healing. If you need help quitting, ask your health care provider.  Take over-the-counter and prescription medicines only as told by your  health care provider.  Keep all follow-up visits as told by your health care provider. This is important. Contact a health care provider if:  Your pain gets worse.  You have new pain in your arm, hands, or fingers.  Your pain is not relieved with medicine or does not get better after 6 weeks of treatment.  You have cracking sensations when moving your shoulder in certain directions.  You hear a snapping sound after using your shoulder, followed by severe pain and weakness. Get help right away if:  Your arm, hand, or fingers are numb or tingling.  Your arm, hand, or fingers are swollen or painful or they turn white or blue. Summary  Rotator cuff tendinitis is inflammation of the tough, cord-like bands that connect muscle to bone (tendons) in the rotator cuff.  This condition is usually caused by overusing the rotator cuff, which includes all of the muscles and tendons that connect the arm to the shoulder.  This condition is more likely to develop in athletes and workers who frequently use their shoulder or reach over their heads.  Treatment generally includes rest, anti-inflammatory medicines, and icing. In some cases, physical therapy and steroid injections may be needed. In severe cases, surgery may be needed. This information is not intended to replace advice given to you by your health care provider. Make sure you discuss any questions you have with your health care provider. Document Revised: 07/16/2018 Document Reviewed: 03/10/2016 Elsevier Patient Education  2020 Elsevier Inc.  

## 2019-08-24 ENCOUNTER — Telehealth: Payer: Self-pay | Admitting: Family Medicine

## 2019-08-24 NOTE — Telephone Encounter (Signed)
ciprofloxacin-dexamethasone (CIPRODEX) OTIC suspension   methocarbamol (ROBAXIN) 750 MG tablet     [facility] - [place], [place] - [address] Phone:  [phone]  Fax:  [phone]

## 2019-08-24 NOTE — Telephone Encounter (Signed)
Please advise medications are not on list

## 2019-08-24 NOTE — Telephone Encounter (Signed)
Refill each once.

## 2019-08-26 ENCOUNTER — Other Ambulatory Visit: Payer: Self-pay | Admitting: *Deleted

## 2019-08-29 MED ORDER — CIPROFLOXACIN-DEXAMETHASONE 0.3-0.1 % OT SUSP: 4.0000 [drp] | Freq: Two times a day (BID) | OTIC | 0 refills | Status: DC

## 2019-08-29 MED ORDER — METHOCARBAMOL 750 MG PO TABS: 750.0000 mg | ORAL_TABLET | Freq: Four times a day (QID) | ORAL | 0 refills | Status: DC

## 2019-08-29 NOTE — Addendum Note (Signed)
Addended by: Raiford Simmonds R on: 08/29/2019 10:35 AM   Modules accepted: Orders

## 2019-08-29 NOTE — Telephone Encounter (Signed)
Refills has been sent in

## 2019-11-16 ENCOUNTER — Other Ambulatory Visit: Payer: Self-pay

## 2019-11-16 ENCOUNTER — Ambulatory Visit: Payer: PRIVATE HEALTH INSURANCE | Admitting: Family Medicine

## 2019-11-16 ENCOUNTER — Encounter: Payer: Self-pay | Admitting: Family Medicine

## 2019-11-16 VITALS — BP 100/80 | HR 79 | Temp 98.1°F | Ht 71.0 in | Wt 238.0 lb

## 2019-11-16 DIAGNOSIS — M25512 Pain in left shoulder: Secondary | ICD-10-CM | POA: Diagnosis not present

## 2019-11-16 DIAGNOSIS — E119 Type 2 diabetes mellitus without complications: Secondary | ICD-10-CM | POA: Diagnosis not present

## 2019-11-16 DIAGNOSIS — E785 Hyperlipidemia, unspecified: Secondary | ICD-10-CM

## 2019-11-16 DIAGNOSIS — I10 Essential (primary) hypertension: Secondary | ICD-10-CM

## 2019-11-16 DIAGNOSIS — J309 Allergic rhinitis, unspecified: Secondary | ICD-10-CM

## 2019-11-16 LAB — POCT GLYCOSYLATED HEMOGLOBIN (HGB A1C): Hemoglobin A1C: 6.7 % — AB (ref 4.0–5.6)

## 2019-11-16 MED ORDER — METHOCARBAMOL 750 MG PO TABS: 750.0000 mg | ORAL_TABLET | Freq: Four times a day (QID) | ORAL | 1 refills | Status: DC

## 2019-11-16 MED ORDER — FLUTICASONE PROPIONATE 50 MCG/ACT NA SUSP: NASAL | 3 refills | Status: DC

## 2019-11-16 MED ORDER — CIPROFLOXACIN-DEXAMETHASONE 0.3-0.1 % OT SUSP: 4.0000 [drp] | Freq: Two times a day (BID) | OTIC | 1 refills | Status: DC

## 2019-11-16 NOTE — Progress Notes (Signed)
r 

## 2019-11-16 NOTE — Progress Notes (Signed)
Established Patient Office Visit  Subjective:  Patient ID: [name], male    DOB: 02-Aug-1965  Age: 54 y.o. MRN: [id number]  CC:  Chief Complaint  Patient presents with  . Diabetes    3 month f/u    HPI [name] presents for multiple issues to address as below  Type 2 diabetes.  Currently takes Metformin 500 mg in the morning and 1000 g at night.  Last A1c was 6.8%.  Not monitoring blood sugars regularly.  No polyuria or polydipsia.  Dyslipidemia.  We recently initiated Lipitor 10 mg daily.  Tolerating with no side effects.  No myalgias.  Needs follow-up lipid and hepatic panel.  He denies any recent chemistries.  Requesting refills of several medications including Robaxin which he uses as needed for some chronic shoulder and back difficulties as well as Ciprodex for chronic otitis media and Flonase for perennial allergies  Persistent left shoulder pain.  Is had several months of shoulder pain without specific injury.  Has had consistent pains with abduction.  Increasing night pain.  Pain is moderate to severe at times.  We suspected rotator cuff tendinitis and did steroid injection in the subacromial region back in May but he has not seen any improvement.  Past Medical History:  Diagnosis Date  . Asthma   . Diarrhea   . Giardiasis    resolved  . Hyperlipidemia   . Hypertension   . Obstructive sleep apnea    sleep study 11/20/95, no apneas, ?obstructive hypopneas?    Past Surgical History:  Procedure Laterality Date  . ENT surgery  1998    Family History  Problem Relation Age of Onset  . Colon cancer Father   . Cancer Father 86       colon  . Heart disease Sister 61       MI    Social History   Socioeconomic History  . Marital status: Married    Spouse name: Not on file  . Number of children: Not on file  . Years of education: Not on file  . Highest education level: Not on file  Occupational History  . Not on file  Tobacco Use  . Smoking  status: Never Smoker  . Smokeless tobacco: Never Used  Substance and Sexual Activity  . Alcohol use: Yes  . Drug use: No  . Sexual activity: Not on file  Other Topics Concern  . Not on file  Social History Narrative   Regular Exercise - YES   Social Determinants of Health   Financial Resource Strain:   . Difficulty of Paying Living Expenses:   Food Insecurity:   . Worried About Charity fundraiser in the Last Year:   . Arboriculturist in the Last Year:   Transportation Needs:   . Film/video editor (Medical):   Marland Kitchen Lack of Transportation (Non-Medical):   Physical Activity:   . Days of Exercise per Week:   . Minutes of Exercise per Session:   Stress:   . Feeling of Stress :   Social Connections:   . Frequency of Communication with Friends and Family:   . Frequency of Social Gatherings with Friends and Family:   . Attends Religious Services:   . Active Member of Clubs or Organizations:   . Attends Archivist Meetings:   Marland Kitchen Marital Status:   Intimate Partner Violence:   . Fear of Current or Ex-Partner:   . Emotionally Abused:   Marland Kitchen Physically  Abused:   . Sexually Abused:     Outpatient Medications Prior to Visit  Medication Sig Dispense Refill  . albuterol (PROVENTIL HFA) 108 (90 BASE) MCG/ACT inhaler Inhale 1 puff into the lungs as needed. For wheezing or shortness of breath     . atorvastatin (LIPITOR) 10 MG tablet Take 1 tablet (10 mg total) by mouth daily. 30 tablet 5  . Blood Glucose Monitoring Suppl (TRUE METRIX METER) w/Device KIT Use as directed. 1 kit 0  . EPINEPHrine (EPIPEN) 0.3 mg/0.3 mL IJ SOAJ injection Inject 0.3 mLs (0.3 mg total) into the muscle once. 1 Device 1  . glucose blood (FREESTYLE LITE) test strip 1 each by Other route 2 (two) times daily. 200 each 12  . Lancets (FREESTYLE) lancets 1 each by Other route 2 (two) times daily. E11.9 200 each 12  . lisinopril (ZESTRIL) 10 MG tablet Take 1 tablet (10 mg total) by mouth daily. 90 tablet 3  .  metFORMIN (GLUCOPHAGE) 500 MG tablet TAKE 1 TABLET BY MOUTH IN THE MORNING AND 2 TABLETS IN THE EVENING. 270 tablet 3  . ciprofloxacin-dexamethasone (CIPRODEX) OTIC suspension Place 4 drops into the right ear 2 (two) times daily. 7.5 mL 0  . fluticasone (FLONASE) 50 MCG/ACT nasal spray PLACE 2 SPRAYS INTO BOTH NOSTRILS DAILY. 48 g 3  . methocarbamol (ROBAXIN) 750 MG tablet Take 1 tablet (750 mg total) by mouth 4 (four) times daily. 120 tablet 0  . ibuprofen (ADVIL,MOTRIN) 600 MG tablet Take 600 mg by mouth once.    Marland Kitchen omeprazole (PRILOSEC) 20 MG capsule TAKE 1 CAPSULE BY MOUTH DAILY. 90 capsule 3   No facility-administered medications prior to visit.    Allergies  Allergen Reactions  . Crestor [Rosuvastatin Calcium] Other (See Comments)    Muscle aches    ROS Review of Systems  Constitutional: Negative for chills, fatigue, fever and unexpected weight change.  Eyes: Negative for visual disturbance.  Respiratory: Negative for cough, chest tightness and shortness of breath.   Cardiovascular: Negative for chest pain, palpitations and leg swelling.  Endocrine: Negative for polydipsia and polyuria.  Neurological: Negative for dizziness, syncope, weakness, light-headedness and headaches.      Objective:    Physical Exam Vitals reviewed.  Constitutional:      Appearance: Normal appearance.  Cardiovascular:     Rate and Rhythm: Normal rate and regular rhythm.  Pulmonary:     Effort: Pulmonary effort is normal.     Breath sounds: Normal breath sounds.  Musculoskeletal:     Comments: Left shoulder reveals pain with abduction greater than 90 degrees  Neurological:     Mental Status: He is alert.     BP 100/80   Pulse 79   Temp 98.1 F (36.7 C) (Other (Comment))   Ht '5\' 11"'  (1.803 m)   Wt 238 lb (108 kg)   SpO2 97%   BMI 33.19 kg/m  Wt Readings from Last 3 Encounters:  11/16/19 238 lb (108 kg)  08/16/19 238 lb 3.2 oz (108 kg)  09/08/17 235 lb 9.6 oz (106.9 kg)      Health Maintenance Due  Topic Date Due  . Hepatitis C Screening  Never done  . COVID-19 Vaccine (1) Never done  . HIV Screening  Never done  . OPHTHALMOLOGY EXAM  06/10/2018  . FOOT EXAM  09/16/2018  . TETANUS/TDAP  05/04/2019  . INFLUENZA VACCINE  11/06/2019    There are no preventive care reminders to display for this patient.  Lab Results  Component Value Date   TSH 1.33 12/27/2015   Lab Results  Component Value Date   WBC 4.2 12/27/2015   HGB 15.9 12/27/2015   HCT 45.8 12/27/2015   MCV 91.1 12/27/2015   PLT 248.0 12/27/2015   Lab Results  Component Value Date   NA 136 12/27/2015   K 4.7 12/27/2015   CO2 30 12/27/2015   GLUCOSE 331 (H) 12/27/2015   BUN 13 12/27/2015   CREATININE 0.97 12/27/2015   BILITOT 0.3 10/09/2017   ALKPHOS 62 10/09/2017   AST 16 10/09/2017   ALT 30 10/09/2017   PROT 7.0 10/09/2017   ALBUMIN 4.6 10/09/2017   CALCIUM 9.2 12/27/2015   GFR 86.73 12/27/2015   Lab Results  Component Value Date   CHOL 237 (H) 10/09/2017   Lab Results  Component Value Date   HDL 36.10 (L) 10/09/2017   Lab Results  Component Value Date   LDLCALC 104 (H) 10/29/2006   Lab Results  Component Value Date   TRIG (H) 10/09/2017    436.0 Triglyceride is over 400; calculations on Lipids are invalid.   Lab Results  Component Value Date   CHOLHDL 7 10/09/2017   Lab Results  Component Value Date   HGBA1C 6.7 (A) 11/16/2019      Assessment & Plan:   Problem List Items Addressed This Visit      Unprioritized   Dyslipidemia   Relevant Orders   Basic metabolic panel   Lipid panel   Hepatic function panel   Type 2 diabetes mellitus without complications ([place]) - Primary   Relevant Orders   POC HgB A1c (Completed)   Essential hypertension    Other Visit Diagnoses    Left shoulder pain, unspecified chronicity       Relevant Orders   Ambulatory referral to Orthopedic Surgery    A1c today 6.7% which is stable.  Continue current diabetes  regimen  Set up referral shoulder specialist regarding his persistent left shoulder pain  Check further labs with lipid, hepatic, basic metabolic panel as above  Refills of several medication given today including Ciprodex, Robaxin, Flonase  Meds ordered this encounter  Medications  . fluticasone (FLONASE) 50 MCG/ACT nasal spray    Sig: Use one spray per nostril once daily    Dispense:  48 g    Refill:  3  . ciprofloxacin-dexamethasone (CIPRODEX) OTIC suspension    Sig: Place 4 drops into the right ear 2 (two) times daily.    Dispense:  7.5 mL    Refill:  1  . methocarbamol (ROBAXIN) 750 MG tablet    Sig: Take 1 tablet (750 mg total) by mouth 4 (four) times daily.    Dispense:  60 tablet    Refill:  1    Follow-up: Return in about 6 months (around 05/18/2020).    Carolann Littler, MD

## 2019-11-17 LAB — HEPATIC FUNCTION PANEL
AG Ratio: 2 (calc) (ref 1.0–2.5)
ALT: 48 U/L — ABNORMAL HIGH (ref 9–46)
AST: 26 U/L (ref 10–35)
Albumin: 4.8 g/dL (ref 3.6–5.1)
Alkaline phosphatase (APISO): 69 U/L (ref 35–144)
Bilirubin, Direct: 0.1 mg/dL (ref 0.0–0.2)
Globulin: 2.4 g/dL (calc) (ref 1.9–3.7)
Indirect Bilirubin: 0.5 mg/dL (calc) (ref 0.2–1.2)
Total Bilirubin: 0.6 mg/dL (ref 0.2–1.2)
Total Protein: 7.2 g/dL (ref 6.1–8.1)

## 2019-11-17 LAB — LIPID PANEL
Cholesterol: 200 mg/dL — ABNORMAL HIGH (ref <200)
HDL: 32 mg/dL — ABNORMAL LOW (ref >=40)
Non-HDL Cholesterol (Calc): 168 mg/dL (calc) — ABNORMAL HIGH (ref <130)
Total CHOL/HDL Ratio: 6.3 (calc) — ABNORMAL HIGH (ref <5.0)
Triglycerides: 452 mg/dL — ABNORMAL HIGH (ref <150)

## 2019-11-17 LAB — BASIC METABOLIC PANEL
BUN: 20 mg/dL (ref 7–25)
CO2: 28 mmol/L (ref 20–32)
Calcium: 10.4 mg/dL — ABNORMAL HIGH (ref 8.6–10.3)
Chloride: 102 mmol/L (ref 98–110)
Creat: 0.9 mg/dL (ref 0.70–1.33)
Glucose, Bld: 175 mg/dL — ABNORMAL HIGH (ref 65–99)
Potassium: 5 mmol/L (ref 3.5–5.3)
Sodium: 139 mmol/L (ref 135–146)

## 2019-11-21 ENCOUNTER — Telehealth: Payer: Self-pay | Admitting: Family Medicine

## 2019-11-21 NOTE — Telephone Encounter (Signed)
Pt returned your call and stated you can call him back when you get a time.

## 2019-11-23 NOTE — Telephone Encounter (Signed)
Lvm for pt to call back. 

## 2020-08-13 ENCOUNTER — Telehealth: Payer: Self-pay | Admitting: Family Medicine

## 2020-08-13 NOTE — Telephone Encounter (Signed)
Okay to order, as requested

## 2020-08-13 NOTE — Telephone Encounter (Signed)
Patient is calling and wanted to se if provider can put in an order for patient to have a Free Style Libra 2 monitor and sensors to   [facility] [place] [address] [place] [address]  Phone:  [phone] Fax:  [phone]  CB is [phone]

## 2020-08-14 MED ORDER — FREESTYLE LIBRE 2 SENSOR MISC: 0 refills | Status: DC

## 2020-08-14 MED ORDER — FREESTYLE LIBRE 2 READER DEVI: 0 refills | Status: AC

## 2020-08-14 NOTE — Addendum Note (Signed)
Addended by: Solon Augusta on: 08/14/2020 09:45 AM   Modules accepted: Orders

## 2020-08-14 NOTE — Telephone Encounter (Signed)
Prescriptions have been sent in. Nothing further needed.  

## 2020-08-20 ENCOUNTER — Other Ambulatory Visit: Payer: Self-pay | Admitting: Family Medicine

## 2020-08-27 ENCOUNTER — Other Ambulatory Visit: Payer: Self-pay

## 2020-08-27 MED ORDER — FREESTYLE LIBRE 2 SENSOR MISC: 3 refills | Status: DC

## 2020-08-28 ENCOUNTER — Other Ambulatory Visit: Payer: Self-pay

## 2020-08-29 ENCOUNTER — Ambulatory Visit: Payer: PRIVATE HEALTH INSURANCE | Admitting: Family Medicine

## 2020-08-29 ENCOUNTER — Encounter: Payer: Self-pay | Admitting: Family Medicine

## 2020-08-29 VITALS — BP 136/86 | HR 76 | Temp 97.7°F | Wt 239.4 lb

## 2020-08-29 DIAGNOSIS — E119 Type 2 diabetes mellitus without complications: Secondary | ICD-10-CM | POA: Diagnosis not present

## 2020-08-29 DIAGNOSIS — R61 Generalized hyperhidrosis: Secondary | ICD-10-CM

## 2020-08-29 DIAGNOSIS — I10 Essential (primary) hypertension: Secondary | ICD-10-CM | POA: Diagnosis not present

## 2020-08-29 LAB — POCT GLYCOSYLATED HEMOGLOBIN (HGB A1C): Hemoglobin A1C: 6.5 % — AB (ref 4.0–5.6)

## 2020-08-29 NOTE — Progress Notes (Signed)
Established Patient Office Visit  Subjective:  Patient ID: [name], male    DOB: [birth year]  Age: 55 y.o. MRN: [id number]  CC:  Chief Complaint  Patient presents with  . 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  His last A1c back in August was 6.7%.  He has continuous monitoring system in place now and has seen some recent slight trend upwards.  Has had some blood sugars up around 200 but mostly averaging around 160.  He had occasional night sweats and occasional lightheadedness past couple weeks.  Not consistently positional.  No vertigo.  No nausea or vomiting.  No chest pains.  No fevers or chills.  Currently taking metformin for diabetes.  He takes lisinopril for hypertension.  His blood pressures have generally been well controlled in the past.  He states has been on lisinopril for almost 30 years.  No recent appetite or weight changes.  No abdominal pain.  No dysuria.  Past Medical History:  Diagnosis Date  . Asthma   . Diarrhea   . Giardiasis    resolved  . Hyperlipidemia   . Hypertension   . Obstructive sleep apnea    sleep study 11/20/95, no apneas, ?obstructive hypopneas?    Past Surgical History:  Procedure Laterality Date  . ENT surgery  1998    Family History  Problem Relation Age of Onset  . Colon cancer Father   . Cancer Father 20       colon  . Heart disease Sister 72       MI    Social History   Socioeconomic History  . Marital status: Married    Spouse name: Not on file  . Number of children: Not on file  . Years of education: Not on file  . Highest education level: Not on file  Occupational History  . Not on file  Tobacco Use  . Smoking status: Never Smoker  . Smokeless tobacco: Never Used  Substance and Sexual Activity  . Alcohol use: Yes  . Drug use: No  . Sexual activity: Not on file  Other Topics Concern  . Not on file  Social History Narrative   Regular Exercise - YES   Social  Determinants of Health   Financial Resource Strain: Not on file  Food Insecurity: Not on file  Transportation Needs: Not on file  Physical Activity: Not on file  Stress: Not on file  Social Connections: Not on file  Intimate Partner Violence: Not on file    Outpatient Medications Prior to Visit  Medication Sig Dispense Refill  . albuterol (VENTOLIN HFA) 108 (90 Base) MCG/ACT inhaler Inhale 1 puff into the lungs as needed. For wheezing or shortness of breath    . atorvastatin (LIPITOR) 10 MG tablet Take 1 tablet (10 mg total) by mouth daily. 30 tablet 5  . Blood Glucose Monitoring Suppl (TRUE METRIX METER) w/Device KIT Use as directed. 1 kit 0  . ciprofloxacin-dexamethasone (CIPRODEX) OTIC suspension Place 4 drops into the right ear 2 (two) times daily. 7.5 mL 1  . Continuous Blood Gluc Receiver (FREESTYLE LIBRE 2 READER) DEVI Use as directed. 1 each 0  . Continuous Blood Gluc Sensor (FREESTYLE LIBRE 2 SENSOR) MISC Use as directed. 2 each 3  . EPINEPHrine (EPIPEN) 0.3 mg/0.3 mL IJ SOAJ injection Inject 0.3 mLs (0.3 mg total) into the muscle once. 1 Device 1  . fluticasone (FLONASE) 50 MCG/ACT nasal spray Use one spray per nostril once  daily 48 g 3  . glucose blood (FREESTYLE LITE) test strip 1 each by Other route 2 (two) times daily. 200 each 12  . Lancets (FREESTYLE) lancets 1 each by Other route 2 (two) times daily. E11.9 200 each 12  . lisinopril (ZESTRIL) 10 MG tablet Take 1 tablet (10 mg total) by mouth daily. 90 tablet 3  . metFORMIN (GLUCOPHAGE) 500 MG tablet TAKE 1 TABLET BY MOUTH IN THE MORNING AND 2 TABLETS IN THE EVENING. 270 tablet 0  . methocarbamol (ROBAXIN) 750 MG tablet Take 1 tablet (750 mg total) by mouth 4 (four) times daily. 60 tablet 1   No facility-administered medications prior to visit.    Allergies  Allergen Reactions  . Crestor [Rosuvastatin Calcium] Other (See Comments)    Muscle aches    ROS Review of Systems  Constitutional: Negative for chills, fever  and unexpected weight change.  Respiratory: Negative for cough and shortness of breath.   Cardiovascular: Negative for chest pain.  Gastrointestinal: Negative for abdominal pain.  Genitourinary: Negative for dysuria.  Neurological: Positive for dizziness and light-headedness. Negative for syncope, speech difficulty and weakness.  Hematological: Negative for adenopathy.      Objective:    Physical Exam Vitals reviewed.  Cardiovascular:     Rate and Rhythm: Normal rate and regular rhythm.  Pulmonary:     Effort: Pulmonary effort is normal.     Breath sounds: Normal breath sounds. No wheezing or rales.  Musculoskeletal:     Right lower leg: No edema.     Left lower leg: No edema.  Neurological:     General: No focal deficit present.     Mental Status: He is alert.     Cranial Nerves: No cranial nerve deficit.     BP 136/86 (BP Location: Right Arm, Cuff Size: Normal)   Pulse 76   Temp 97.7 F (36.5 C) (Oral)   Wt 239 lb 6.4 oz (108.6 kg)   SpO2 97%   BMI 33.39 kg/m  Wt Readings from Last 3 Encounters:  08/29/20 239 lb 6.4 oz (108.6 kg)  11/16/19 238 lb (108 kg)  08/16/19 238 lb 3.2 oz (108 kg)     Health Maintenance Due  Topic Date Due  . COVID-19 Vaccine (1) Never done  . HIV Screening  Never done  . Hepatitis C Screening  Never done  . OPHTHALMOLOGY EXAM  06/10/2018  . FOOT EXAM  09/16/2018  . TETANUS/TDAP  05/04/2019    There are no preventive care reminders to display for this patient.  Lab Results  Component Value Date   TSH 1.33 12/27/2015   Lab Results  Component Value Date   WBC 4.2 12/27/2015   HGB 15.9 12/27/2015   HCT 45.8 12/27/2015   MCV 91.1 12/27/2015   PLT 248.0 12/27/2015   Lab Results  Component Value Date   NA 139 11/16/2019   K 5.0 11/16/2019   CO2 28 11/16/2019   GLUCOSE 175 (H) 11/16/2019   BUN 20 11/16/2019   CREATININE 0.90 11/16/2019   BILITOT 0.6 11/16/2019   ALKPHOS 62 10/09/2017   AST 26 11/16/2019   ALT 48 (H)  11/16/2019   PROT 7.2 11/16/2019   ALBUMIN 4.6 10/09/2017   CALCIUM 10.4 (H) 11/16/2019   GFR 86.73 12/27/2015   Lab Results  Component Value Date   CHOL 200 (H) 11/16/2019   Lab Results  Component Value Date   HDL 32 (L) 11/16/2019   Lab Results  Component Value Date  [place]  11/16/2019     Comment:     . LDL cholesterol not calculated. Triglyceride levels greater than 400 mg/dL invalidate calculated LDL results. . Reference range: <100 . Desirable range <100 mg/dL for primary prevention;   <70 mg/dL for patients with CHD or diabetic patients  with > or = 2 CHD risk factors. Marland Kitchen LDL-C is now calculated using the Martin-Hopkins  calculation, which is a validated novel method providing  better accuracy than the Friedewald equation in the  estimation of LDL-C.  Cresenciano Genre et al. Annamaria Helling. 6948;546(27): 2061-2068  (http://education.[facility].com/faq/FAQ164)    Lab Results  Component Value Date   TRIG 452 (H) 11/16/2019   Lab Results  Component Value Date   CHOLHDL 6.3 (H) 11/16/2019   Lab Results  Component Value Date   HGBA1C 6.5 (A) 08/29/2020      Assessment & Plan:   #1 type 2 diabetes stable with A1c today 6.5% -Continue metformin at current dosage.  Continue to monitor closely.  If this continues to trend upwards over the next several weeks be in touch  #2 nonspecific intermittent diaphoresis/dizziness.  No recent documented fever.  No significant hypoglycemia risk with metformin therapy.    -Check further labs with chemistries, CBC, TSH -Monitor closely for any fever  #3 hypertension.  Currently treated with lisinopril 10 mg daily.  Today's blood pressure mildly elevated. -Continue current dose of lisinopril 10 mg daily and monitor closely.  If consistent readings greater than 130/80 over the next couple months consider further titration of lisinopril.  No orders of the defined types were placed in this encounter.   Follow-up: No follow-ups on  file.    Carolann Littler, MD

## 2020-08-30 LAB — CBC WITH DIFFERENTIAL/PLATELET
Basophils Absolute: 0.1 10*3/uL (ref 0.0–0.1)
Basophils Relative: 1 % (ref 0.0–3.0)
Eosinophils Absolute: 0.1 10*3/uL (ref 0.0–0.7)
Eosinophils Relative: 1.5 % (ref 0.0–5.0)
HCT: 42.5 % (ref 39.0–52.0)
Hemoglobin: 14.7 g/dL (ref 13.0–17.0)
Lymphocytes Relative: 30.5 % (ref 12.0–46.0)
Lymphs Abs: 1.6 10*3/uL (ref 0.7–4.0)
MCHC: 34.5 g/dL (ref 30.0–36.0)
MCV: 96.3 fl (ref 78.0–100.0)
Monocytes Absolute: 0.6 10*3/uL (ref 0.1–1.0)
Monocytes Relative: 10.5 % (ref 3.0–12.0)
Neutro Abs: 3 10*3/uL (ref 1.4–7.7)
Neutrophils Relative %: 56.5 % (ref 43.0–77.0)
Platelets: 287 10*3/uL (ref 150.0–400.0)
RBC: 4.41 Mil/uL (ref 4.22–5.81)
RDW: 13.3 % (ref 11.5–15.5)
WBC: 5.3 10*3/uL (ref 4.0–10.5)

## 2020-08-30 LAB — TSH: TSH: 1.41 u[IU]/mL (ref 0.35–4.50)

## 2020-08-30 LAB — COMPREHENSIVE METABOLIC PANEL
ALT: 33 U/L (ref 0–53)
AST: 22 U/L (ref 0–37)
Albumin: 4.7 g/dL (ref 3.5–5.2)
Alkaline Phosphatase: 59 U/L (ref 39–117)
BUN: 19 mg/dL (ref 6–23)
CO2: 26 mEq/L (ref 19–32)
Calcium: 10.1 mg/dL (ref 8.4–10.5)
Chloride: 100 mEq/L (ref 96–112)
Creatinine, Ser: 1.02 mg/dL (ref 0.40–1.50)
GFR: 82.65 mL/min (ref >60.00)
Glucose, Bld: 105 mg/dL — ABNORMAL HIGH (ref 70–99)
Potassium: 4.3 mEq/L (ref 3.5–5.1)
Sodium: 137 mEq/L (ref 135–145)
Total Bilirubin: 0.4 mg/dL (ref 0.2–1.2)
Total Protein: 7.2 g/dL (ref 6.0–8.3)

## 2020-10-24 ENCOUNTER — Telehealth: Payer: Self-pay | Admitting: Family Medicine

## 2020-10-24 MED ORDER — LISINOPRIL 10 MG PO TABS: 10.0000 mg | ORAL_TABLET | Freq: Every day | ORAL | 0 refills | Status: DC

## 2020-10-24 NOTE — Telephone Encounter (Signed)
Left a detailed message on verified voice mail informing the patient that his refills have been sent in. Patient will need a physical for any further refills.

## 2020-10-24 NOTE — Telephone Encounter (Signed)
Pt is calling in stating that he is out of Rx lisinopril (ZESTRIL) 10 MG and had called the office on Monday 10/22/2020 to get it refilled.  Pharm: [facility]  Pt would like to see if it can be called in today.

## 2020-10-25 LAB — HM DIABETES EYE EXAM

## 2020-11-11 ENCOUNTER — Other Ambulatory Visit: Payer: Self-pay | Admitting: Family Medicine

## 2020-12-17 ENCOUNTER — Ambulatory Visit (INDEPENDENT_AMBULATORY_CARE_PROVIDER_SITE_OTHER): Payer: No Typology Code available for payment source | Admitting: Family Medicine

## 2020-12-17 ENCOUNTER — Other Ambulatory Visit: Payer: Self-pay

## 2020-12-17 VITALS — BP 118/80 | HR 85 | Temp 98.3°F | Wt 229.3 lb

## 2020-12-17 DIAGNOSIS — E785 Hyperlipidemia, unspecified: Secondary | ICD-10-CM | POA: Diagnosis not present

## 2020-12-17 DIAGNOSIS — E119 Type 2 diabetes mellitus without complications: Secondary | ICD-10-CM | POA: Diagnosis not present

## 2020-12-17 DIAGNOSIS — I10 Essential (primary) hypertension: Secondary | ICD-10-CM | POA: Diagnosis not present

## 2020-12-17 LAB — POCT GLYCOSYLATED HEMOGLOBIN (HGB A1C): Hemoglobin A1C: 6.5 % — AB (ref 4.0–5.6)

## 2020-12-17 MED ORDER — METFORMIN HCL 500 MG PO TABS: ORAL_TABLET | ORAL | 3 refills | Status: DC

## 2020-12-17 MED ORDER — FLUTICASONE PROPIONATE 50 MCG/ACT NA SUSP: NASAL | 3 refills | Status: DC

## 2020-12-17 MED ORDER — LISINOPRIL 10 MG PO TABS: 10.0000 mg | ORAL_TABLET | Freq: Every day | ORAL | 3 refills | Status: DC

## 2020-12-17 NOTE — Progress Notes (Signed)
Established Patient Office Visit  Subjective:  Patient ID: [name], male    DOB: 1965-10-30  Age: 55 y.o. MRN: [id number]  CC:  Chief Complaint  Patient presents with   Medication Refill    HPI FREMAN LAPAGE presents for medical follow-up.  He has type 2 diabetes and home blood sugars are generally been stable.  Still has occasional fasting around 160.  Last A1c 6.5%.  Remains on metformin 500 mg 1 morning and 2 in the evening.  He is on excellent job with exercise past few weeks and has lost 10 pounds already since last weight here.  Currently exercising on treadmill about 45 minutes several days per week  History of dyslipidemia.  Intolerance to multiple statins with myalgias.  He hopes to give lifestyle changes another couple months and then get follow-up fasting labs.  He is requesting refills of several medications today including lisinopril, metformin, Flonase.  Blood pressures have generally been stable.  No headaches or dizziness.  No chest pain.  Past Medical History:  Diagnosis Date   Asthma    Diarrhea    Giardiasis    resolved   Hyperlipidemia    Hypertension    Obstructive sleep apnea    sleep study 11/20/95, no apneas, ?obstructive hypopneas?    Past Surgical History:  Procedure Laterality Date   ENT surgery  1998    Family History  Problem Relation Age of Onset   Colon cancer Father    Cancer Father 62       colon   Heart disease Sister 67       MI    Social History   Socioeconomic History   Marital status: Married    Spouse name: Not on file   Number of children: Not on file   Years of education: Not on file   Highest education level: Not on file  Occupational History   Not on file  Tobacco Use   Smoking status: Never   Smokeless tobacco: Never  Substance and Sexual Activity   Alcohol use: Yes   Drug use: No   Sexual activity: Not on file  Other Topics Concern   Not on file  Social History Narrative   Regular Exercise -  YES   Social Determinants of Health   Financial Resource Strain: Not on file  Food Insecurity: Not on file  Transportation Needs: Not on file  Physical Activity: Not on file  Stress: Not on file  Social Connections: Not on file  Intimate Partner Violence: Not on file    Outpatient Medications Prior to Visit  Medication Sig Dispense Refill   albuterol (VENTOLIN HFA) 108 (90 Base) MCG/ACT inhaler Inhale 1 puff into the lungs as needed. For wheezing or shortness of breath     atorvastatin (LIPITOR) 10 MG tablet Take 1 tablet (10 mg total) by mouth daily. 30 tablet 5   Blood Glucose Monitoring Suppl (TRUE METRIX METER) w/Device KIT Use as directed. 1 kit 0   ciprofloxacin-dexamethasone (CIPRODEX) OTIC suspension Place 4 drops into the right ear 2 (two) times daily. 7.5 mL 1   Continuous Blood Gluc Receiver (FREESTYLE LIBRE 2 READER) DEVI Use as directed. 1 each 0   Continuous Blood Gluc Sensor (FREESTYLE LIBRE 2 SENSOR) MISC Use as directed. 2 each 3   EPINEPHrine (EPIPEN) 0.3 mg/0.3 mL IJ SOAJ injection Inject 0.3 mLs (0.3 mg total) into the muscle once. 1 Device 1   glucose blood (FREESTYLE LITE) test strip 1 each  by Other route 2 (two) times daily. 200 each 12   Lancets (FREESTYLE) lancets 1 each by Other route 2 (two) times daily. E11.9 200 each 12   methocarbamol (ROBAXIN) 750 MG tablet Take 1 tablet (750 mg total) by mouth 4 (four) times daily. 60 tablet 1   fluticasone (FLONASE) 50 MCG/ACT nasal spray Use one spray per nostril once daily 48 g 3   lisinopril (ZESTRIL) 10 MG tablet Take 1 tablet (10 mg total) by mouth daily. 90 tablet 0   metFORMIN (GLUCOPHAGE) 500 MG tablet TAKE 1 TABLET BY MOUTH IN THE MORNING AND 2 TABLETS IN THE EVENING. 270 tablet 0   No facility-administered medications prior to visit.    Allergies  Allergen Reactions   Crestor [Rosuvastatin Calcium] Other (See Comments)    Muscle aches    ROS Review of Systems  Constitutional:  Negative for fatigue and  unexpected weight change.  Eyes:  Negative for visual disturbance.  Respiratory:  Negative for cough, chest tightness and shortness of breath.   Cardiovascular:  Negative for chest pain, palpitations and leg swelling.  Endocrine: Negative for polydipsia and polyuria.  Neurological:  Negative for dizziness, syncope, weakness, light-headedness and headaches.     Objective:    Physical Exam Constitutional:      Appearance: He is well-developed.  HENT:     Right Ear: External ear normal.     Left Ear: External ear normal.  Eyes:     Pupils: Pupils are equal, round, and reactive to light.  Neck:     Thyroid: No thyromegaly.  Cardiovascular:     Rate and Rhythm: Normal rate and regular rhythm.  Pulmonary:     Effort: Pulmonary effort is normal. No respiratory distress.     Breath sounds: Normal breath sounds. No wheezing or rales.  Musculoskeletal:     Cervical back: Neck supple.     Right lower leg: No edema.     Left lower leg: No edema.  Neurological:     Mental Status: He is alert and oriented to person, place, and time.    BP 118/80 (BP Location: Right Arm, Cuff Size: Normal)   Pulse 85   Temp 98.3 F (36.8 C) (Oral)   Wt 229 lb 4.8 oz (104 kg)   SpO2 98%   BMI 31.98 kg/m  Wt Readings from Last 3 Encounters:  12/17/20 229 lb 4.8 oz (104 kg)  08/29/20 239 lb 6.4 oz (108.6 kg)  11/16/19 238 lb (108 kg)     Health Maintenance Due  Topic Date Due   COVID-19 Vaccine (1) Never done   HIV Screening  Never done   Hepatitis C Screening  Never done   OPHTHALMOLOGY EXAM  06/10/2018   FOOT EXAM  09/16/2018   TETANUS/TDAP  05/04/2019   Zoster Vaccines- Shingrix (2 of 2) 10/11/2019   INFLUENZA VACCINE  11/05/2020    There are no preventive care reminders to display for this patient.  Lab Results  Component Value Date   TSH 1.41 08/29/2020   Lab Results  Component Value Date   WBC 5.3 08/29/2020   HGB 14.7 08/29/2020   HCT 42.5 08/29/2020   MCV 96.3 08/29/2020    PLT 287.0 08/29/2020   Lab Results  Component Value Date   NA 137 08/29/2020   K 4.3 08/29/2020   CO2 26 08/29/2020   GLUCOSE 105 (H) 08/29/2020   BUN 19 08/29/2020   CREATININE 1.02 08/29/2020   BILITOT 0.4 08/29/2020   ALKPHOS  59 08/29/2020   AST 22 08/29/2020   ALT 33 08/29/2020   PROT 7.2 08/29/2020   ALBUMIN 4.7 08/29/2020   CALCIUM 10.1 08/29/2020   GFR 82.65 08/29/2020   Lab Results  Component Value Date   CHOL 200 (H) 11/16/2019   Lab Results  Component Value Date   HDL 32 (L) 11/16/2019   Lab Results  Component Value Date   [facility]  11/16/2019     Comment:     . LDL cholesterol not calculated. Triglyceride levels greater than 400 mg/dL invalidate calculated LDL results. . Reference range: <100 . Desirable range <100 mg/dL for primary prevention;   <70 mg/dL for patients with CHD or diabetic patients  with > or = 2 CHD risk factors. Marland Kitchen LDL-C is now calculated using the Martin-Hopkins  calculation, which is a validated novel method providing  better accuracy than the Friedewald equation in the  estimation of LDL-C.  Cresenciano Genre et al. Annamaria Helling. 2508;719(94): 2061-2068  (http://education.[facility].com/faq/FAQ164)    Lab Results  Component Value Date   TRIG 452 (H) 11/16/2019   Lab Results  Component Value Date   CHOLHDL 6.3 (H) 11/16/2019   Lab Results  Component Value Date   HGBA1C 6.5 (A) 12/17/2020      Assessment & Plan:   #1 type 2 diabetes stable with A1c today 6.5%. -Refill metformin for 1 year -Recommend yearly diabetic eye exam -Continue recent exercise and lifestyle changes  #2 hypertension stable with repeat reading today improved.  Continue weight loss efforts.  Refill lisinopril for 1 year  #3 history of dyslipidemia.  We did discuss today possible options including Zetia or Repatha given his intolerance to multiple statins.  He would like to recheck fasting lipids though at follow-up in 3 months and then go from there as  far as decision making.   Meds ordered this encounter  Medications   metFORMIN (GLUCOPHAGE) 500 MG tablet    Sig: Take one tablet by mouth each morning and two tablets by mouth each evening.    Dispense:  270 tablet    Refill:  3   fluticasone (FLONASE) 50 MCG/ACT nasal spray    Sig: Use one spray per nostril once daily    Dispense:  48 g    Refill:  3   lisinopril (ZESTRIL) 10 MG tablet    Sig: Take 1 tablet (10 mg total) by mouth daily.    Dispense:  90 tablet    Refill:  3    Follow-up: Return in about 3 months (around 03/18/2021).    Carolann Littler, MD

## 2020-12-17 NOTE — Patient Instructions (Signed)
A1C today is 6.5%.  Keep up the good work!  Let's plan on 3 month follow up and get fasting labs at that time to reassess lipids

## 2020-12-26 ENCOUNTER — Other Ambulatory Visit: Payer: Self-pay | Admitting: Family Medicine

## 2021-01-11 ENCOUNTER — Telehealth: Payer: Self-pay | Admitting: Family Medicine

## 2021-01-11 MED ORDER — METFORMIN HCL 500 MG PO TABS: ORAL_TABLET | ORAL | 0 refills | Status: DC

## 2021-01-11 MED ORDER — LISINOPRIL 10 MG PO TABS: 10.0000 mg | ORAL_TABLET | Freq: Every day | ORAL | 0 refills | Status: DC

## 2021-01-11 NOTE — Telephone Encounter (Signed)
Pt call and stated he need a 30 day supply of metFORMIN (GLUCOPHAGE) 500 MG tablet  and lisinopril (ZESTRIL) 10 MG tablet sent to [facility] [place] pt stated he will pay out of pocket because he is going out of [employer] .

## 2021-01-11 NOTE — Telephone Encounter (Signed)
Left a detailed message on verified voice mail informing the patient that he has a year supply of medication on file at his pharmacy.

## 2021-01-11 NOTE — Telephone Encounter (Signed)
Patient called back and stated that his pharmacy will not allow him to pay out of pocket for his medications and that [facility]

## 2021-01-23 ENCOUNTER — Encounter: Payer: Self-pay | Admitting: Family Medicine

## 2021-01-24 NOTE — Telephone Encounter (Signed)
error 

## 2021-04-05 ENCOUNTER — Telehealth: Payer: Self-pay | Admitting: Family Medicine

## 2021-04-05 ENCOUNTER — Other Ambulatory Visit: Payer: Self-pay | Admitting: Family Medicine

## 2021-04-05 MED ORDER — FLUTICASONE PROPIONATE 50 MCG/ACT NA SUSP: NASAL | 3 refills | Status: DC

## 2021-04-05 NOTE — Telephone Encounter (Signed)
Pharmacy called because patient wants a change to the prescription for [facility]. Patient wants to do two sprays per nostril a day instead of one.  Pharmacy will need a new prescription   Please send to [facility] - [place], [place] - [address] Phone:  [phone]  Fax:  [phone]        Please advise

## 2021-04-05 NOTE — Telephone Encounter (Signed)
Rx sent in. Left a detailed message on verified voice mail informing the patient.  

## 2021-07-05 ENCOUNTER — Ambulatory Visit (INDEPENDENT_AMBULATORY_CARE_PROVIDER_SITE_OTHER): Payer: PRIVATE HEALTH INSURANCE | Admitting: Family Medicine

## 2021-07-05 ENCOUNTER — Encounter: Payer: Self-pay | Admitting: Family Medicine

## 2021-07-05 ENCOUNTER — Ambulatory Visit (INDEPENDENT_AMBULATORY_CARE_PROVIDER_SITE_OTHER): Payer: PRIVATE HEALTH INSURANCE

## 2021-07-05 VITALS — BP 120/82 | HR 77 | Temp 98.1°F | Ht 71.0 in | Wt 227.0 lb

## 2021-07-05 DIAGNOSIS — E119 Type 2 diabetes mellitus without complications: Secondary | ICD-10-CM

## 2021-07-05 DIAGNOSIS — R002 Palpitations: Secondary | ICD-10-CM

## 2021-07-05 DIAGNOSIS — N529 Male erectile dysfunction, unspecified: Secondary | ICD-10-CM | POA: Diagnosis not present

## 2021-07-05 DIAGNOSIS — E785 Hyperlipidemia, unspecified: Secondary | ICD-10-CM | POA: Diagnosis not present

## 2021-07-05 DIAGNOSIS — I10 Essential (primary) hypertension: Secondary | ICD-10-CM | POA: Diagnosis not present

## 2021-07-05 DIAGNOSIS — R7989 Other specified abnormal findings of blood chemistry: Secondary | ICD-10-CM

## 2021-07-05 LAB — LIPID PANEL
Cholesterol: 198 mg/dL (ref 0–200)
HDL: 28.7 mg/dL — ABNORMAL LOW (ref >39.00)
Total CHOL/HDL Ratio: 7
Triglycerides: 431 mg/dL — ABNORMAL HIGH (ref 0.0–149.0)

## 2021-07-05 LAB — TESTOSTERONE: Testosterone: 190.09 ng/dL — ABNORMAL LOW (ref 300.00–890.00)

## 2021-07-05 LAB — LDL CHOLESTEROL, DIRECT: Direct LDL: 87 mg/dL

## 2021-07-05 LAB — TSH: TSH: 1.38 u[IU]/mL (ref 0.35–5.50)

## 2021-07-05 LAB — HEMOGLOBIN A1C: Hgb A1c MFr Bld: 6.5 % (ref 4.6–6.5)

## 2021-07-05 MED ORDER — EPINEPHRINE 0.3 MG/0.3ML IJ SOAJ: 0.3000 mg | Freq: Once | INTRAMUSCULAR | 1 refills | Status: AC

## 2021-07-05 MED ORDER — FREESTYLE LIBRE 2 SENSOR MISC: 3 refills | Status: DC

## 2021-07-05 MED ORDER — TADALAFIL 20 MG PO TABS: 10.0000 mg | ORAL_TABLET | ORAL | 11 refills | Status: DC | PRN

## 2021-07-05 MED ORDER — OZEMPIC (0.25 OR 0.5 MG/DOSE) 2 MG/1.5ML ~~LOC~~ SOPN: PEN_INJECTOR | SUBCUTANEOUS | 1 refills | Status: DC

## 2021-07-05 MED ORDER — FLUTICASONE PROPIONATE 50 MCG/ACT NA SUSP: NASAL | 3 refills | Status: DC

## 2021-07-05 NOTE — Progress Notes (Unsigned)
Enrolled patient for a 14 day Zio XT  monitor to be mailed to patients home  °

## 2021-07-05 NOTE — Progress Notes (Signed)
? ?Established Patient Office Visit ? ?Subjective:  ?Patient ID: [name], male    DOB: [birth year]  Age: 56 y.o. MRN: [id number] ? ?CC:  ?Chief Complaint  ?Patient presents with  ? Follow-up  ? Palpitations  ? ? ?HPI ?Kerby Less presents for multiple issues as follows ? ?He has had a couple episodes recently of tachyarrhythmia.  Episodes lasted less than a minute.  He had associated dizziness but no syncope.  No chest pain.  He wonders if this may have been SVT versus A-fib/flutter.  No documented history of either.  Has had 2 episodes during the past month.  No clear triggers. ? ?Type 2 diabetes history.  Last A1c 6.5%.  He is struggled with weight loss.  Currently on metformin.  Inquires about possible medication such as Ozempic to aid with better diabetes control as well as weight loss.  Does not have any known contraindications. ? ?Recent issues with erectile dysfunction.  He has a new girlfriend.  Has never tried Cialis or Viagra.  Non-smoker.  Libido is relatively good.  He would like to get his testosterone level checked. ? ?History of perennial and seasonal allergies.  Requesting refills of Flonase. ? ?Past Medical History:  ?Diagnosis Date  ? Asthma   ? Diarrhea   ? Giardiasis   ? resolved  ? Hyperlipidemia   ? Hypertension   ? Obstructive sleep apnea   ? sleep study 11/20/95, no apneas, ?obstructive hypopneas?  ? ? ?Past Surgical History:  ?Procedure Laterality Date  ? ENT surgery  1998  ? ? ?Family History  ?Problem Relation Age of Onset  ? Colon cancer Father   ? Cancer Father 35  ?     colon  ? Heart disease Sister 56  ?     MI  ? ? ?Social History  ? ?Socioeconomic History  ? Marital status: Married  ?  Spouse name: Not on file  ? Number of children: Not on file  ? Years of education: Not on file  ? Highest education level: Not on file  ?Occupational History  ? Not on file  ?Tobacco Use  ? Smoking status: Never  ? Smokeless tobacco: Never  ?Substance and Sexual Activity  ? Alcohol use:  Yes  ? Drug use: No  ? Sexual activity: Not on file  ?Other Topics Concern  ? Not on file  ?Social History Narrative  ? Regular Exercise - YES  ? ?Social Determinants of Health  ? ?Financial Resource Strain: Not on file  ?Food Insecurity: Not on file  ?Transportation Needs: Not on file  ?Physical Activity: Not on file  ?Stress: Not on file  ?Social Connections: Not on file  ?Intimate Partner Violence: Not on file  ? ? ?Outpatient Medications Prior to Visit  ?Medication Sig Dispense Refill  ? albuterol (VENTOLIN HFA) 108 (90 Base) MCG/ACT inhaler Inhale 1 puff into the lungs as needed. For wheezing or shortness of breath    ? Blood Glucose Monitoring Suppl (TRUE METRIX METER) w/Device KIT Use as directed. 1 kit 0  ? ciprofloxacin-dexamethasone (CIPRODEX) OTIC suspension Place 4 drops into the right ear 2 (two) times daily. 7.5 mL 1  ? Continuous Blood Gluc Receiver (FREESTYLE LIBRE 2 READER) DEVI Use as directed. 1 each 0  ? lisinopril (ZESTRIL) 10 MG tablet Take 1 tablet (10 mg total) by mouth daily. 90 tablet 0  ? metFORMIN (GLUCOPHAGE) 500 MG tablet Take one tablet by mouth each morning and two tablets by mouth each  evening. 270 tablet 0  ? methocarbamol (ROBAXIN) 750 MG tablet Take 1 tablet (750 mg total) by mouth 4 (four) times daily. 60 tablet 1  ? atorvastatin (LIPITOR) 10 MG tablet Take 1 tablet (10 mg total) by mouth daily. 30 tablet 5  ? Continuous Blood Gluc Sensor (FREESTYLE LIBRE 2 SENSOR) MISC Use as directed 2 each 3  ? EPINEPHrine (EPIPEN) 0.3 mg/0.3 mL IJ SOAJ injection Inject 0.3 mLs (0.3 mg total) into the muscle once. 1 Device 1  ? fluticasone (FLONASE) 50 MCG/ACT nasal spray Use two sprays per nostril once daily 48 g 3  ? glucose blood (FREESTYLE LITE) test strip 1 each by Other route 2 (two) times daily. 200 each 12  ? Lancets (FREESTYLE) lancets 1 each by Other route 2 (two) times daily. E11.9 200 each 12  ? ?No facility-administered medications prior to visit.  ? ? ?Allergies  ?Allergen  Reactions  ? Crestor [Rosuvastatin Calcium] Other (See Comments)  ?  Muscle aches  ? ? ?ROS ?Review of Systems  ?Constitutional:  Negative for fatigue and unexpected weight change.  ?Eyes:  Negative for visual disturbance.  ?Respiratory:  Negative for cough, chest tightness and shortness of breath.   ?Cardiovascular:  Positive for palpitations. Negative for chest pain and leg swelling.  ?Endocrine: Negative for polydipsia and polyuria.  ?Neurological:  Positive for dizziness. Negative for syncope, weakness, light-headedness and headaches.  ?     See HPI  ? ?  ?Objective:  ?  ?Physical Exam ?Vitals reviewed.  ?Constitutional:   ?   Appearance: He is well-developed.  ?HENT:  ?   Right Ear: External ear normal.  ?   Left Ear: External ear normal.  ?Eyes:  ?   Pupils: Pupils are equal, round, and reactive to light.  ?Neck:  ?   Thyroid: No thyromegaly.  ?Cardiovascular:  ?   Rate and Rhythm: Normal rate and regular rhythm.  ?   Heart sounds: No murmur heard. ?Pulmonary:  ?   Effort: Pulmonary effort is normal. No respiratory distress.  ?   Breath sounds: Normal breath sounds. No wheezing or rales.  ?Musculoskeletal:  ?   Cervical back: Neck supple.  ?Neurological:  ?   Mental Status: He is alert and oriented to person, place, and time.  ? ? ?BP 120/82 (BP Location: Left Arm, Patient Position: Sitting, Cuff Size: Normal)   Pulse 77   Temp 98.1 ?F (36.7 ?C) (Oral)   Ht _0  (1.803 m)   Wt 227 lb (103 kg)   SpO2 97%   BMI 31.66 kg/m?  ?Wt Readings from Last 3 Encounters:  ?07/05/21 227 lb (103 kg)  ?12/17/20 229 lb 4.8 oz (104 kg)  ?08/29/20 239 lb 6.4 oz (108.6 kg)  ? ? ? ?Health Maintenance Due  ?Topic Date Due  ? HIV Screening  Never done  ? Hepatitis C Screening  Never done  ? FOOT EXAM  09/16/2018  ? TETANUS/TDAP  05/04/2019  ? Zoster Vaccines- Shingrix (2 of 2) 10/11/2019  ? INFLUENZA VACCINE  11/05/2020  ? COVID-19 Vaccine (1) 01/12/2021  ? HEMOGLOBIN A1C  06/16/2021  ? ? ?There are no preventive care  reminders to display for this patient. ? ?Lab Results  ?Component Value Date  ? TSH 1.41 08/29/2020  ? ?Lab Results  ?Component Value Date  ? WBC 5.3 08/29/2020  ? HGB 14.7 08/29/2020  ? HCT 42.5 08/29/2020  ? MCV 96.3 08/29/2020  ? PLT 287.0 08/29/2020  ? ?Lab Results  ?  Component Value Date  ? NA 137 08/29/2020  ? K 4.3 08/29/2020  ? CO2 26 08/29/2020  ? GLUCOSE 105 (H) 08/29/2020  ? BUN 19 08/29/2020  ? CREATININE 1.02 08/29/2020  ? BILITOT 0.4 08/29/2020  ? ALKPHOS 59 08/29/2020  ? AST 22 08/29/2020  ? ALT 33 08/29/2020  ? PROT 7.2 08/29/2020  ? ALBUMIN 4.7 08/29/2020  ? CALCIUM 10.1 08/29/2020  ? GFR 82.65 08/29/2020  ? ?Lab Results  ?Component Value Date  ? CHOL 200 (H) 11/16/2019  ? ?Lab Results  ?Component Value Date  ? HDL 32 (L) 11/16/2019  ? ?Lab Results  ?Component Value Date  ? [place]  11/16/2019  ?   Comment:  ?   . ?LDL cholesterol not calculated. Triglyceride levels ?greater than 400 mg/dL invalidate calculated LDL results. ?. ?Reference range: <100 ?Marland Kitchen ?Desirable range <100 mg/dL for primary prevention;   ?<70 mg/dL for patients with CHD or diabetic patients  ?with > or = 2 CHD risk factors. ?. ?LDL-C is now calculated using the Martin-Hopkins  ?calculation, which is a validated novel method providing  ?better accuracy than the Friedewald equation in the  ?estimation of LDL-C.  ?Cresenciano Genre et al. Annamaria Helling. 6629;476(54): 2061-2068  ?(http://education.[facility].com/faq/FAQ164) ?  ? ?Lab Results  ?Component Value Date  ? TRIG 452 (H) 11/16/2019  ? ?Lab Results  ?Component Value Date  ? CHOLHDL 6.3 (H) 11/16/2019  ? ?Lab Results  ?Component Value Date  ? HGBA1C 6.5 (A) 12/17/2020  ? ? ?  ?Assessment & Plan:  ? ?#1 palpitations.  2 distinct episodes of tachyarrhythmia associated with dizziness but no syncope.  EKG here shows sinus rhythm with no acute abnormalities.  Set up Zio patch 14-day monitor.  Consider cardiology referral pending on results ? ?#2 type 2 diabetes.  History of fair control.  We did  discuss possibility of medication such as GLP-1 class with his desire to lose further weight and tighten up control further.  We will consider Ozempic 0.25 mg subcutaneous once weekly for 4 weeks and increase to 0.5 m

## 2021-07-08 NOTE — Addendum Note (Signed)
Addended by: Nilda Riggs on: 07/08/2021 10:44 AM ? ? Modules accepted: Orders ? ?

## 2021-07-15 ENCOUNTER — Encounter: Payer: Self-pay | Admitting: Family Medicine

## 2021-07-31 ENCOUNTER — Telehealth: Payer: Self-pay | Admitting: Family Medicine

## 2021-07-31 DIAGNOSIS — R7989 Other specified abnormal findings of blood chemistry: Secondary | ICD-10-CM

## 2021-07-31 DIAGNOSIS — R002 Palpitations: Secondary | ICD-10-CM | POA: Diagnosis not present

## 2021-07-31 NOTE — Telephone Encounter (Signed)
Pt called stating per previous convo, it was ok for the prescription for testosterone to be initiated.  ?

## 2021-08-01 NOTE — Telephone Encounter (Signed)
Pt informed that rx has been sent and lab app scheduled  ?

## 2021-08-01 NOTE — Telephone Encounter (Addendum)
Left a message for the pt to return my call. Labs have been ordered ?

## 2021-08-07 ENCOUNTER — Other Ambulatory Visit (INDEPENDENT_AMBULATORY_CARE_PROVIDER_SITE_OTHER): Payer: PRIVATE HEALTH INSURANCE

## 2021-08-07 DIAGNOSIS — R7989 Other specified abnormal findings of blood chemistry: Secondary | ICD-10-CM

## 2021-08-07 LAB — CBC WITH DIFFERENTIAL/PLATELET
Basophils Absolute: 0.1 10*3/uL (ref 0.0–0.1)
Basophils Relative: 1 % (ref 0.0–3.0)
Eosinophils Absolute: 0.1 10*3/uL (ref 0.0–0.7)
Eosinophils Relative: 2.7 % (ref 0.0–5.0)
HCT: 46.5 % (ref 39.0–52.0)
Hemoglobin: 16 g/dL (ref 13.0–17.0)
Lymphocytes Relative: 31.2 % (ref 12.0–46.0)
Lymphs Abs: 1.6 10*3/uL (ref 0.7–4.0)
MCHC: 34.4 g/dL (ref 30.0–36.0)
MCV: 96.8 fl (ref 78.0–100.0)
Monocytes Absolute: 0.5 10*3/uL (ref 0.1–1.0)
Monocytes Relative: 10.3 % (ref 3.0–12.0)
Neutro Abs: 2.9 10*3/uL (ref 1.4–7.7)
Neutrophils Relative %: 54.8 % (ref 43.0–77.0)
Platelets: 271 10*3/uL (ref 150.0–400.0)
RBC: 4.8 Mil/uL (ref 4.22–5.81)
RDW: 13 % (ref 11.5–15.5)
WBC: 5.2 10*3/uL (ref 4.0–10.5)

## 2021-08-07 LAB — TESTOSTERONE: Testosterone: 257.12 ng/dL — ABNORMAL LOW (ref 300.00–890.00)

## 2021-08-07 LAB — PSA: PSA: 0.58 ng/mL (ref 0.10–4.00)

## 2021-08-08 LAB — PROLACTIN: Prolactin: 5.6 ng/mL (ref 2.0–18.0)

## 2021-08-08 MED ORDER — TESTOSTERONE 20.25 MG/ACT (1.62%) TD GEL: TRANSDERMAL | 2 refills | Status: DC

## 2021-08-08 NOTE — Telephone Encounter (Signed)
Pt is calling back and testosterone needs PA   ?

## 2021-08-09 NOTE — Telephone Encounter (Signed)
PA for Testosterone has been approved and left vm for pt to return my call.  (Key: [id number]) ?

## 2021-09-03 ENCOUNTER — Encounter: Payer: Self-pay | Admitting: Family Medicine

## 2021-09-03 MED ORDER — TESTOSTERONE CYPIONATE 200 MG/ML IM SOLN: 200.0000 mg | INTRAMUSCULAR | 0 refills | Status: DC

## 2021-10-07 ENCOUNTER — Ambulatory Visit: Payer: PRIVATE HEALTH INSURANCE | Admitting: Family Medicine

## 2021-10-07 ENCOUNTER — Encounter: Payer: Self-pay | Admitting: Family Medicine

## 2021-10-07 VITALS — BP 118/88 | HR 88 | Temp 97.8°F | Ht 71.0 in | Wt 228.6 lb

## 2021-10-07 DIAGNOSIS — E119 Type 2 diabetes mellitus without complications: Secondary | ICD-10-CM

## 2021-10-07 DIAGNOSIS — R7989 Other specified abnormal findings of blood chemistry: Secondary | ICD-10-CM | POA: Diagnosis not present

## 2021-10-07 LAB — POCT GLYCOSYLATED HEMOGLOBIN (HGB A1C): Hemoglobin A1C: 5.8 % — AB (ref 4.0–5.6)

## 2021-10-07 LAB — CBC WITH DIFFERENTIAL/PLATELET
Basophils Absolute: 0 10*3/uL (ref 0.0–0.1)
Basophils Relative: 0.8 % (ref 0.0–3.0)
Eosinophils Absolute: 0.1 10*3/uL (ref 0.0–0.7)
Eosinophils Relative: 2.3 % (ref 0.0–5.0)
HCT: 48.1 % (ref 39.0–52.0)
Hemoglobin: 16.3 g/dL (ref 13.0–17.0)
Lymphocytes Relative: 21.8 % (ref 12.0–46.0)
Lymphs Abs: 1.2 10*3/uL (ref 0.7–4.0)
MCHC: 34 g/dL (ref 30.0–36.0)
MCV: 98.3 fl (ref 78.0–100.0)
Monocytes Absolute: 0.7 10*3/uL (ref 0.1–1.0)
Monocytes Relative: 12.6 % — ABNORMAL HIGH (ref 3.0–12.0)
Neutro Abs: 3.5 10*3/uL (ref 1.4–7.7)
Neutrophils Relative %: 62.5 % (ref 43.0–77.0)
Platelets: 247 10*3/uL (ref 150.0–400.0)
RBC: 4.89 Mil/uL (ref 4.22–5.81)
RDW: 13.4 % (ref 11.5–15.5)
WBC: 5.7 10*3/uL (ref 4.0–10.5)

## 2021-10-07 LAB — TESTOSTERONE: Testosterone: 433.9 ng/dL (ref 300.00–890.00)

## 2021-10-07 MED ORDER — OMEPRAZOLE 20 MG PO CPDR: 20.0000 mg | DELAYED_RELEASE_CAPSULE | Freq: Every day | ORAL | 1 refills | Status: DC

## 2021-10-07 NOTE — Progress Notes (Signed)
Established Patient Office Visit  Subjective   Patient ID: [name], male    DOB: Oct 07, 1965  Age: 56 y.o. MRN: [id number]  Chief Complaint  Patient presents with   Follow-up    HPI   Recently went on testosterone replacement.  Tolerating well.  He is on 200 mg every 2 weeks.  Here today to recheck testosterone level and CBC.  He also started recently on Ozempic and is currently on 0.5 mg subcutaneous once weekly.  Does have some bloating after eating but no significant nausea and no vomiting.  Fasting blood sugars have improved substantially.  Last A1c was 6.5% down to 5.8% today.  He plans to start exercising more soon  Past Medical History:  Diagnosis Date   Asthma    Diarrhea    Giardiasis    resolved   Hyperlipidemia    Hypertension    Obstructive sleep apnea    sleep study 11/20/95, no apneas, ?obstructive hypopneas?   Past Surgical History:  Procedure Laterality Date   ENT surgery  1998    reports that he has never smoked. He has never used smokeless tobacco. He reports current alcohol use. He reports that he does not use drugs. family history includes Cancer (age of onset: 83) in his father; Colon cancer in his father; Heart disease (age of onset: 37) in his sister. Allergies  Allergen Reactions   Crestor [Rosuvastatin Calcium] Other (See Comments)    Muscle aches    Review of Systems  Constitutional:  Negative for malaise/fatigue.  Eyes:  Negative for blurred vision.  Respiratory:  Negative for shortness of breath.   Cardiovascular:  Negative for chest pain.  Neurological:  Negative for dizziness, weakness and headaches.      Objective:     BP 118/88 (BP Location: Right Arm, Patient Position: Sitting, Cuff Size: Large)   Pulse 88   Temp 97.8 F (36.6 C) (Oral)   Ht 5\' 11"  (1.803 m)   Wt 228 lb 9.6 oz (103.7 kg)   SpO2 97%   BMI 31.88 kg/m    Physical Exam Constitutional:      Appearance: He is well-developed.  HENT:     Right Ear:  External ear normal.     Left Ear: External ear normal.  Eyes:     Pupils: Pupils are equal, round, and reactive to light.  Neck:     Thyroid: No thyromegaly.  Cardiovascular:     Rate and Rhythm: Normal rate and regular rhythm.  Pulmonary:     Effort: Pulmonary effort is normal. No respiratory distress.     Breath sounds: Normal breath sounds. No wheezing or rales.  Musculoskeletal:     Cervical back: Neck supple.  Neurological:     Mental Status: He is alert and oriented to person, place, and time.      Results for orders placed or performed in visit on 10/07/21  POC HgB A1c  Result Value Ref Range   Hemoglobin A1C 5.8 (A) 4.0 - 5.6 %   HbA1c POC (<> result, manual entry)     HbA1c, POC (prediabetic range)     HbA1c, POC (controlled diabetic range)        The 10-year ASCVD risk score (Arnett DK, et al., 2019) is: 18%    Assessment & Plan:   Problem List Items Addressed This Visit       Unprioritized   Low testosterone   Relevant Orders   Testosterone   CBC with Differential/Platelet  Type 2 diabetes mellitus without complications (HCC) - Primary   Relevant Orders   POC HgB A1c (Completed)  -A1c much improved today to 5.8%.  Continue Ozempic 0.5 mg subcutaneous once weekly.  He is not interested in further titration given his bloated symptoms but he is tolerating fairly well overall  Recheck testosterone level today and CBC.  Plan routine follow-up in 6 months and sooner as needed  Return in about 6 months (around 04/09/2022).    Evelena Peat, MD

## 2021-11-22 ENCOUNTER — Other Ambulatory Visit: Payer: Self-pay | Admitting: Family Medicine

## 2021-12-19 ENCOUNTER — Other Ambulatory Visit: Payer: Self-pay | Admitting: Family Medicine

## 2022-01-23 ENCOUNTER — Other Ambulatory Visit: Payer: Self-pay | Admitting: Family Medicine

## 2022-01-25 ENCOUNTER — Other Ambulatory Visit: Payer: Self-pay | Admitting: Family Medicine

## 2022-03-10 ENCOUNTER — Other Ambulatory Visit: Payer: Self-pay

## 2022-03-10 MED ORDER — OZEMPIC (0.25 OR 0.5 MG/DOSE) 2 MG/1.5ML ~~LOC~~ SOPN: PEN_INJECTOR | SUBCUTANEOUS | 1 refills | Status: DC

## 2022-06-03 ENCOUNTER — Encounter: Payer: Self-pay | Admitting: Family Medicine

## 2022-06-05 MED ORDER — TESTOSTERONE CYPIONATE 200 MG/ML IM SOLN: INTRAMUSCULAR | 1 refills | Status: DC

## 2022-06-05 MED ORDER — CIPROFLOXACIN-DEXAMETHASONE 0.3-0.1 % OT SUSP: 4.0000 [drp] | Freq: Two times a day (BID) | OTIC | 2 refills | Status: DC

## 2022-06-11 MED ORDER — OZEMPIC (0.25 OR 0.5 MG/DOSE) 2 MG/1.5ML ~~LOC~~ SOPN: PEN_INJECTOR | SUBCUTANEOUS | 1 refills | Status: DC

## 2022-06-11 NOTE — Telephone Encounter (Signed)
Rx sent and refilled

## 2022-06-11 NOTE — Addendum Note (Signed)
Addended by: Nilda Riggs on: 06/11/2022 08:57 AM   Modules accepted: Orders

## 2022-06-11 NOTE — Telephone Encounter (Signed)
Pt is leaving town on Friday 06/13/22 and really needs this addressed asap...  Please send to:  [place], [place] Phone: [phone]  Fax: [phone]      Also, Pt is requesting a refill on Ozempic.

## 2022-07-07 ENCOUNTER — Other Ambulatory Visit: Payer: Self-pay

## 2022-07-07 MED ORDER — FREESTYLE LIBRE 2 SENSOR MISC: 0 refills | Status: DC

## 2022-07-07 MED ORDER — TADALAFIL 20 MG PO TABS: 10.0000 mg | ORAL_TABLET | ORAL | 0 refills | Status: DC | PRN

## 2022-07-10 ENCOUNTER — Other Ambulatory Visit: Payer: Self-pay | Admitting: Family Medicine

## 2022-07-22 ENCOUNTER — Other Ambulatory Visit: Payer: Self-pay

## 2022-07-22 MED ORDER — TADALAFIL 20 MG PO TABS: 10.0000 mg | ORAL_TABLET | ORAL | 0 refills | Status: DC | PRN

## 2022-08-12 ENCOUNTER — Other Ambulatory Visit: Payer: Self-pay

## 2022-08-12 MED ORDER — FLUTICASONE PROPIONATE 50 MCG/ACT NA SUSP: NASAL | 3 refills | Status: DC

## 2022-09-30 ENCOUNTER — Other Ambulatory Visit: Payer: Self-pay | Admitting: Family Medicine

## 2022-11-04 ENCOUNTER — Other Ambulatory Visit: Payer: Self-pay | Admitting: Family Medicine

## 2022-12-24 ENCOUNTER — Ambulatory Visit: Payer: PRIVATE HEALTH INSURANCE | Admitting: Family Medicine

## 2022-12-24 ENCOUNTER — Encounter: Payer: Self-pay | Admitting: Family Medicine

## 2022-12-24 VITALS — BP 150/98 | HR 80 | Temp 98.0°F | Ht 71.0 in | Wt 229.3 lb

## 2022-12-24 DIAGNOSIS — Z7184 Encounter for health counseling related to travel: Secondary | ICD-10-CM

## 2022-12-24 DIAGNOSIS — Z23 Encounter for immunization: Secondary | ICD-10-CM

## 2022-12-24 DIAGNOSIS — E119 Type 2 diabetes mellitus without complications: Secondary | ICD-10-CM | POA: Diagnosis not present

## 2022-12-24 DIAGNOSIS — M10471 Other secondary gout, right ankle and foot: Secondary | ICD-10-CM

## 2022-12-24 LAB — HM COLONOSCOPY

## 2022-12-24 MED ORDER — AZITHROMYCIN 250 MG PO TABS: ORAL_TABLET | ORAL | 0 refills | Status: DC

## 2022-12-24 MED ORDER — COLCHICINE 0.6 MG PO TABS: 0.6000 mg | ORAL_TABLET | Freq: Two times a day (BID) | ORAL | 2 refills | Status: DC | PRN

## 2022-12-24 MED ORDER — PREDNISONE 20 MG PO TABS: ORAL_TABLET | ORAL | 0 refills | Status: DC

## 2022-12-24 NOTE — Progress Notes (Unsigned)
Established Patient Office Visit  Subjective   Patient ID: [name], male    DOB: 29-Apr-1965  Age: 57 y.o. MRN: [id number]  Chief Complaint  Patient presents with   Toe Pain    Patient compains of right great toe pain, x1 week, Tried Motrin and Prednisone     HPI  {History (Optional):23778} [name] is here with pain involving right MTP joint of the great toe.  Is had gout in that same joint previously with only a few flareups over the past 7 or so years.  He has tried some Motrin and had some leftover prednisone which have helped slightly.  No known injury.  No fevers or chills.  Getting ready to travel out of country.  Requesting prescription for Zithromax prior to travel.  Would also like to have something with him in case he has recurrent acute gout flare.  Does not recall using colchicine previously  Does have history of hypertension and type 2 diabetes.  Overdue for labs although he states he had several labs done per GI recently.  Not aware of A1c was done.  Blood pressure up a bit today but this is atypical for him.  He remains on Ozempic for diabetes.  He is requesting Shingrix vaccine.  He had first vaccine 2021 but never got second 1  Past Medical History:  Diagnosis Date   Asthma    Diarrhea    Giardiasis    resolved   Hyperlipidemia    Hypertension    Obstructive sleep apnea    sleep study 11/20/95, no apneas, ?obstructive hypopneas?   Past Surgical History:  Procedure Laterality Date   ENT surgery  1998    reports that he has never smoked. He has never used smokeless tobacco. He reports current alcohol use. He reports that he does not use drugs. family history includes Cancer (age of onset: 80) in his father; Colon cancer in his father; Heart disease (age of onset: 50) in his sister. Allergies  Allergen Reactions   Crestor [Rosuvastatin Calcium] Other (See Comments)    Muscle aches    Review of Systems  Constitutional:  Negative for chills, fever  and malaise/fatigue.  Eyes:  Negative for blurred vision.  Respiratory:  Negative for shortness of breath.   Cardiovascular:  Negative for chest pain.  Neurological:  Negative for dizziness, weakness and headaches.      Objective:     BP (!) 150/98 (BP Location: Left Arm, Patient Position: Sitting, Cuff Size: Normal)   Pulse 80   Temp 98 F (36.7 C) (Oral)   Ht 5\' 11"  (1.803 m)   Wt 229 lb 4.8 oz (104 kg)   SpO2 98%   BMI 31.98 kg/m  BP Readings from Last 3 Encounters:  12/24/22 (!) 150/98  10/07/21 118/88  07/05/21 120/82   Wt Readings from Last 3 Encounters:  12/24/22 229 lb 4.8 oz (104 kg)  10/07/21 228 lb 9.6 oz (103.7 kg)  07/05/21 227 lb (103 kg)      Physical Exam Constitutional:      Appearance: He is well-developed.  HENT:     Right Ear: External ear normal.     Left Ear: External ear normal.  Eyes:     Pupils: Pupils are equal, round, and reactive to light.  Neck:     Thyroid: No thyromegaly.  Cardiovascular:     Rate and Rhythm: Normal rate and regular rhythm.  Pulmonary:     Effort: Pulmonary effort is normal. No  respiratory distress.     Breath sounds: Normal breath sounds. No wheezing or rales.  Musculoskeletal:     Cervical back: Neck supple.     Comments: Right great toe reveals some erythema and warmth and mild to moderate tenderness of the MTP joint.  No ulceration.  Neurological:     Mental Status: He is alert and oriented to person, place, and time.      Results for orders placed or performed in visit on 12/24/22  HM COLONOSCOPY  Result Value Ref Range   HM Colonoscopy See Report (in chart) See Report (in chart), Patient Reported    Last CBC Lab Results  Component Value Date   WBC 5.7 10/07/2021   HGB 16.3 10/07/2021   HCT 48.1 10/07/2021   MCV 98.3 10/07/2021   RDW 13.4 10/07/2021   PLT 247.0 10/07/2021   Last metabolic panel Lab Results  Component Value Date   GLUCOSE 105 (H) 08/29/2020   NA 137 08/29/2020   K 4.3  08/29/2020   CL 100 08/29/2020   CO2 26 08/29/2020   BUN 19 08/29/2020   CREATININE 1.02 08/29/2020   GFR 82.65 08/29/2020   CALCIUM 10.1 08/29/2020   PROT 7.2 08/29/2020   ALBUMIN 4.7 08/29/2020   BILITOT 0.4 08/29/2020   ALKPHOS 59 08/29/2020   AST 22 08/29/2020   ALT 33 08/29/2020   Last lipids Lab Results  Component Value Date   CHOL 198 07/05/2021   HDL 28.70 (L) 07/05/2021   LDLCALC  11/16/2019     Comment:     . LDL cholesterol not calculated. Triglyceride levels greater than 400 mg/dL invalidate calculated LDL results. . Reference range: <100 . Desirable range <100 mg/dL for primary prevention;   <70 mg/dL for patients with CHD or diabetic patients  with > or = 2 CHD risk factors. Marland Kitchen LDL-C is now calculated using the Martin-Hopkins  calculation, which is a validated novel method providing  better accuracy than the Friedewald equation in the  estimation of LDL-C.  [name] et al. Lenox Ahr. 1610;960(45): 2061-2068  (http://education.[facility].com/faq/FAQ164)    LDLDIRECT 87.0 07/05/2021   TRIG (H) 07/05/2021    431.0 Triglyceride is over 400; calculations on Lipids are invalid.   CHOLHDL 7 07/05/2021   Last hemoglobin A1c Lab Results  Component Value Date   HGBA1C 5.8 (A) 10/07/2021      The 10-year ASCVD risk score (Arnett DK, et al., 2019) is: 28%    Assessment & Plan:   #1 acute gout flare involving right foot first MTP joint.  Wrote for prednisone 20 mg 2 tablets daily for 5 days.  He is aware this may exacerbate blood sugars.  Also wrote for colchicine 0.6 mg 2 at onset of gout and 1 every 12 hours as needed for flareups -Handout on gout given -Discussed prophylaxis with medication such as allopurinol but he is not interested at this point. -He has no history of tophaceous gout  #2 travel advice encounter.  Patient requesting prescription for Zithromax for upcoming travel to [place] region.  This was prescribed  #3 type 2 diabetes.  Overdue  for A1c.  He had colonoscopy earlier today.  Prefers to come back for labs later and we will aim for around December  #4 health maintenance.  Patient requesting Shingrix vaccine.  He will have to reinitiate series since he had about a 3-year lapse from first 1 in the series   No follow-ups on file.    Evelena Peat, MD

## 2023-01-06 ENCOUNTER — Other Ambulatory Visit: Payer: Self-pay | Admitting: Family Medicine

## 2023-01-07 ENCOUNTER — Encounter: Payer: Self-pay | Admitting: Family Medicine

## 2023-01-07 MED ORDER — OMEPRAZOLE 20 MG PO CPDR: 20.0000 mg | DELAYED_RELEASE_CAPSULE | Freq: Every day | ORAL | 1 refills | Status: DC

## 2023-01-27 ENCOUNTER — Other Ambulatory Visit: Payer: Self-pay | Admitting: Family Medicine

## 2023-01-27 MED ORDER — "BD LUER-LOK SYRINGE 18G X 1-1/2"" 3 ML MISC": 0 refills | Status: DC

## 2023-01-27 MED ORDER — TESTOSTERONE CYPIONATE 200 MG/ML IM SOLN: INTRAMUSCULAR | 1 refills | Status: DC

## 2023-01-27 MED ORDER — TADALAFIL 20 MG PO TABS: ORAL_TABLET | ORAL | 0 refills | Status: DC

## 2023-01-27 MED ORDER — "BD PRECISIONGLIDE NEEDLE 23G X 1-1/2"" MISC": 0 refills | Status: DC

## 2023-02-04 ENCOUNTER — Ambulatory Visit (INDEPENDENT_AMBULATORY_CARE_PROVIDER_SITE_OTHER): Payer: PRIVATE HEALTH INSURANCE | Admitting: Family Medicine

## 2023-02-04 ENCOUNTER — Encounter: Payer: Self-pay | Admitting: Family Medicine

## 2023-02-04 VITALS — BP 128/80 | HR 88 | Temp 97.4°F | Ht 71.0 in | Wt 225.3 lb

## 2023-02-04 DIAGNOSIS — Z0000 Encounter for general adult medical examination without abnormal findings: Secondary | ICD-10-CM

## 2023-02-04 DIAGNOSIS — R7989 Other specified abnormal findings of blood chemistry: Secondary | ICD-10-CM

## 2023-02-04 DIAGNOSIS — E781 Pure hyperglyceridemia: Secondary | ICD-10-CM

## 2023-02-04 DIAGNOSIS — E119 Type 2 diabetes mellitus without complications: Secondary | ICD-10-CM

## 2023-02-04 DIAGNOSIS — Z1159 Encounter for screening for other viral diseases: Secondary | ICD-10-CM

## 2023-02-04 DIAGNOSIS — Z789 Other specified health status: Secondary | ICD-10-CM | POA: Diagnosis not present

## 2023-02-04 MED ORDER — TESTOSTERONE CYPIONATE 200 MG/ML IM SOLN: INTRAMUSCULAR | 1 refills | Status: DC

## 2023-02-04 MED ORDER — TADALAFIL 20 MG PO TABS: ORAL_TABLET | ORAL | 11 refills | Status: DC

## 2023-02-04 NOTE — Patient Instructions (Signed)
We might want to consider PCSK-9 inhibitor medication such as Praluent or Repatha.

## 2023-02-04 NOTE — Progress Notes (Signed)
Established Patient Office Visit  Subjective   Patient ID: [name], male    DOB: 1965-12-06  Age: 57 y.o. MRN: [id number]  Chief Complaint  Patient presents with   Annual Exam    HPI   [name] is seen for physical exam.  He has history of hypertension, type 2 diabetes, GERD, low testosterone, dyslipidemia.  History of statin intolerance.  He tried multiple statins previously-but had significant myalgias with each of them..  Generally doing well.  Just recently returned from a trip to [place].  He had flu vaccine last week.  Due for follow-up labs.  Health maintenance reviewed:  Health Maintenance  Topic Date Due   HIV Screening  Never done   Diabetic kidney evaluation - Urine ACR  Never done   Hepatitis C Screening  Never done   FOOT EXAM  09/16/2018   Diabetic kidney evaluation - eGFR measurement  08/29/2021   HEMOGLOBIN A1C  04/09/2022   OPHTHALMOLOGY EXAM  11/04/2023   DTaP/Tdap/Td (3 - Td or Tdap) 12/03/2031   Colonoscopy  12/23/2032   INFLUENZA VACCINE  Completed   COVID-19 Vaccine  Completed   Zoster Vaccines- Shingrix  Completed   HPV VACCINES  Aged Out   -Does consent to hepatitis C screening.  Low risk.  Family history-mother died age 31 of suicide.  Father died age 45 of metastatic colon cancer.  He had a brother age 31 with morbid obesity, history of substance abuse, CAD who died age 48.  Brother age 90 with type 2 diabetes.  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  Non-smoker.  No regular alcohol.  Past Medical History:  Diagnosis Date   Asthma    Diarrhea    Giardiasis    resolved   Hyperlipidemia    Hypertension    Obstructive sleep apnea    sleep study 11/20/95, no apneas, ?obstructive hypopneas?   Past Surgical History:  Procedure Laterality Date   ENT surgery  1998    reports that he has never smoked. He has never used smokeless tobacco. He reports current alcohol use. He reports that he does not use drugs. family history  includes Cancer (age of onset: 64) in his father; Colon cancer in his father; Heart disease (age of onset: 40) in his sister. Allergies  Allergen Reactions   Crestor [Rosuvastatin Calcium] Other (See Comments)    Muscle aches    Review of Systems  Constitutional:  Negative for chills, fever, malaise/fatigue and weight loss.  HENT:  Negative for hearing loss.   Eyes:  Negative for blurred vision and double vision.  Respiratory:  Negative for cough and shortness of breath.   Cardiovascular:  Negative for chest pain, palpitations and leg swelling.  Gastrointestinal:  Negative for abdominal pain, blood in stool, constipation and diarrhea.  Genitourinary:  Negative for dysuria.  Skin:  Negative for rash.  Neurological:  Negative for dizziness, speech change, seizures, loss of consciousness and headaches.  Psychiatric/Behavioral:  Negative for depression.       Objective:     BP 128/80 (BP Location: Right Arm, Cuff Size: Normal)   Pulse 88   Temp (!) 97.4 F (36.3 C) (Oral)   Ht 5\' 11"  (1.803 m)   Wt 225 lb 4.8 oz (102.2 kg)   SpO2 99%   BMI 31.42 kg/m  BP Readings from Last 3 Encounters:  02/04/23 128/80  12/24/22 (!) 150/98  10/07/21 118/88   Wt Readings from Last 3 Encounters:  02/04/23 225  lb 4.8 oz (102.2 kg)  12/24/22 229 lb 4.8 oz (104 kg)  10/07/21 228 lb 9.6 oz (103.7 kg)      Physical Exam Vitals reviewed.  Constitutional:      General: He is not in acute distress.    Appearance: He is well-developed.  HENT:     Head: Normocephalic and atraumatic.     Right Ear: External ear normal.     Left Ear: External ear normal.  Eyes:     Conjunctiva/sclera: Conjunctivae normal.     Pupils: Pupils are equal, round, and reactive to light.  Neck:     Thyroid: No thyromegaly.  Cardiovascular:     Rate and Rhythm: Normal rate and regular rhythm.     Heart sounds: Normal heart sounds. No murmur heard. Pulmonary:     Effort: No respiratory distress.     Breath  sounds: No wheezing or rales.  Abdominal:     General: Bowel sounds are normal. There is no distension.     Palpations: Abdomen is soft. There is no mass.     Tenderness: There is no abdominal tenderness. There is no guarding or rebound.  Musculoskeletal:     Cervical back: Normal range of motion and neck supple.  Lymphadenopathy:     Cervical: No cervical adenopathy.  Skin:    Findings: No rash.     Comments: Feet reveal no skin lesions. Good distal foot pulses. Good capillary refill. No calluses. Normal sensation with monofilament testing   Neurological:     Mental Status: He is alert and oriented to person, place, and time.     Cranial Nerves: No cranial nerve deficit.      No results found for any visits on 02/04/23.  Last CBC Lab Results  Component Value Date   WBC 5.7 10/07/2021   HGB 16.3 10/07/2021   HCT 48.1 10/07/2021   MCV 98.3 10/07/2021   RDW 13.4 10/07/2021   PLT 247.0 10/07/2021   Last metabolic panel Lab Results  Component Value Date   GLUCOSE 105 (H) 08/29/2020   NA 137 08/29/2020   K 4.3 08/29/2020   CL 100 08/29/2020   CO2 26 08/29/2020   BUN 19 08/29/2020   CREATININE 1.02 08/29/2020   GFR 82.65 08/29/2020   CALCIUM 10.1 08/29/2020   PROT 7.2 08/29/2020   ALBUMIN 4.7 08/29/2020   BILITOT 0.4 08/29/2020   ALKPHOS 59 08/29/2020   AST 22 08/29/2020   ALT 33 08/29/2020   Last lipids Lab Results  Component Value Date   CHOL 198 07/05/2021   HDL 28.70 (L) 07/05/2021   LDLCALC  11/16/2019     Comment:     . LDL cholesterol not calculated. Triglyceride levels greater than 400 mg/dL invalidate calculated LDL results. . Reference range: <100 . Desirable range <100 mg/dL for primary prevention;   <70 mg/dL for patients with CHD or diabetic patients  with > or = 2 CHD risk factors. Marland Kitchen LDL-C is now calculated using the Martin-Hopkins  calculation, which is a validated novel method providing  better accuracy than the Friedewald equation in  the  estimation of LDL-C.  [name] et al. Lenox Ahr. 1610;960(45): 2061-2068  (http://education.[facility].com/faq/FAQ164)    LDLDIRECT 87.0 07/05/2021   TRIG (H) 07/05/2021    431.0 Triglyceride is over 400; calculations on Lipids are invalid.   CHOLHDL 7 07/05/2021   Last hemoglobin A1c Lab Results  Component Value Date   HGBA1C 5.8 (A) 10/07/2021   Last thyroid functions Lab Results  Component Value Date   TSH 1.38 07/05/2021      The 10-year ASCVD risk score (Arnett DK, et al., 2019) is: 23.3%    Assessment & Plan:   Problem List Items Addressed This Visit       Unprioritized   Statin intolerance - Primary   Relevant Orders   Lipid panel   Hepatic function panel   Low testosterone   Relevant Orders   CBC with Differential/Platelet   Testosterone   Type 2 diabetes mellitus without complications (HCC)   Relevant Orders   Hemoglobin A1c   Microalbumin / creatinine urine ratio   CT CARDIAC SCORING (SELF PAY ONLY)   Other Visit Diagnoses     Physical exam       Relevant Orders   Basic metabolic panel   CBC with Differential/Platelet   PSA   Encounter for hepatitis C screening test for low risk patient       Relevant Orders   Hep C Antibody     57 year old male with chronic medical problems as above who is seen today for physical exam.  He has history of statin intolerance with multiple statins previously.  We discussed several items as follows  -Flu vaccine already given -Colonoscopy up-to-date but does need every 5-year follow-up with family history -Discussed coronary calcium score for further restratification especially given his family history of CAD and his diabetes and previous statin intolerance. -Discussed potential PCSK9 inhibitor use.  He would like to get coronary calcium score first for further risk stratification -Check hepatitis C antibody -Refill Cialis and testosterone  No follow-ups on file.    Evelena Peat, MD

## 2023-02-05 LAB — MICROALBUMIN / CREATININE URINE RATIO
Creatinine,U: 90.9 mg/dL
Microalb Creat Ratio: 0.9 mg/g (ref 0.0–30.0)
Microalb, Ur: 0.8 mg/dL (ref 0.0–1.9)

## 2023-02-05 LAB — CBC WITH DIFFERENTIAL/PLATELET
Basophils Absolute: 0.1 10*3/uL (ref 0.0–0.1)
Basophils Relative: 0.8 % (ref 0.0–3.0)
Eosinophils Absolute: 0.1 10*3/uL (ref 0.0–0.7)
Eosinophils Relative: 1.7 % (ref 0.0–5.0)
HCT: 48.8 % (ref 39.0–52.0)
Hemoglobin: 16.5 g/dL (ref 13.0–17.0)
Lymphocytes Relative: 28.1 % (ref 12.0–46.0)
Lymphs Abs: 1.8 10*3/uL (ref 0.7–4.0)
MCHC: 33.7 g/dL (ref 30.0–36.0)
MCV: 97.8 fL (ref 78.0–100.0)
Monocytes Absolute: 0.6 10*3/uL (ref 0.1–1.0)
Monocytes Relative: 8.7 % (ref 3.0–12.0)
Neutro Abs: 3.9 10*3/uL (ref 1.4–7.7)
Neutrophils Relative %: 60.7 % (ref 43.0–77.0)
Platelets: 297 10*3/uL (ref 150.0–400.0)
RBC: 4.99 Mil/uL (ref 4.22–5.81)
RDW: 13.4 % (ref 11.5–15.5)
WBC: 6.5 10*3/uL (ref 4.0–10.5)

## 2023-02-05 LAB — BASIC METABOLIC PANEL
BUN: 21 mg/dL (ref 6–23)
CO2: 30 meq/L (ref 19–32)
Calcium: 9.5 mg/dL (ref 8.4–10.5)
Chloride: 99 meq/L (ref 96–112)
Creatinine, Ser: 1.01 mg/dL (ref 0.40–1.50)
GFR: 82.21 mL/min (ref >60.00)
Glucose, Bld: 94 mg/dL (ref 70–99)
Potassium: 4.2 meq/L (ref 3.5–5.1)
Sodium: 137 meq/L (ref 135–145)

## 2023-02-05 LAB — LIPID PANEL
Cholesterol: 190 mg/dL (ref 0–200)
HDL: 30.5 mg/dL — ABNORMAL LOW (ref >39.00)
Total CHOL/HDL Ratio: 6
Triglycerides: 446 mg/dL — ABNORMAL HIGH (ref 0.0–149.0)

## 2023-02-05 LAB — HEPATIC FUNCTION PANEL
ALT: 26 U/L (ref 0–53)
AST: 22 U/L (ref 0–37)
Albumin: 4.7 g/dL (ref 3.5–5.2)
Alkaline Phosphatase: 51 U/L (ref 39–117)
Bilirubin, Direct: 0.1 mg/dL (ref 0.0–0.3)
Total Bilirubin: 0.7 mg/dL (ref 0.2–1.2)
Total Protein: 7.3 g/dL (ref 6.0–8.3)

## 2023-02-05 LAB — LDL CHOLESTEROL, DIRECT: Direct LDL: 85 mg/dL

## 2023-02-05 LAB — TESTOSTERONE: Testosterone: 774.49 ng/dL (ref 300.00–890.00)

## 2023-02-05 LAB — HEMOGLOBIN A1C: Hgb A1c MFr Bld: 5.9 % (ref 4.6–6.5)

## 2023-02-05 LAB — PSA: PSA: 0.68 ng/mL (ref 0.10–4.00)

## 2023-02-05 LAB — HEPATITIS C ANTIBODY: Hepatitis C Ab: NONREACTIVE

## 2023-02-10 ENCOUNTER — Telehealth: Payer: Self-pay | Admitting: Family Medicine

## 2023-02-10 NOTE — Telephone Encounter (Signed)
Pt returning call for results

## 2023-02-11 ENCOUNTER — Other Ambulatory Visit: Payer: Self-pay

## 2023-02-11 MED ORDER — METFORMIN HCL 500 MG PO TABS: ORAL_TABLET | ORAL | 1 refills | Status: DC

## 2023-02-11 MED ORDER — LISINOPRIL 10 MG PO TABS: 10.0000 mg | ORAL_TABLET | Freq: Every day | ORAL | 1 refills | Status: DC

## 2023-02-12 MED ORDER — FLUTICASONE PROPIONATE 50 MCG/ACT NA SUSP: NASAL | 1 refills | Status: DC

## 2023-02-12 NOTE — Addendum Note (Signed)
Addended by: Christy Sartorius on: 02/12/2023 09:33 AM   Modules accepted: Orders

## 2023-02-12 NOTE — Telephone Encounter (Signed)
Pt returned call

## 2023-02-12 NOTE — Telephone Encounter (Signed)
Please see result note 

## 2023-03-09 ENCOUNTER — Ambulatory Visit (INDEPENDENT_AMBULATORY_CARE_PROVIDER_SITE_OTHER): Payer: PRIVATE HEALTH INSURANCE

## 2023-03-09 ENCOUNTER — Ambulatory Visit: Payer: PRIVATE HEALTH INSURANCE | Admitting: Family Medicine

## 2023-03-09 DIAGNOSIS — Z23 Encounter for immunization: Secondary | ICD-10-CM

## 2023-03-09 NOTE — Progress Notes (Signed)
Confirm with provider's CMA, Mykal before administer pt with shingle vaccine.   Mykal states pt is re-starting the dose. Since his complete dose is over due.

## 2023-03-25 ENCOUNTER — Ambulatory Visit (HOSPITAL_BASED_OUTPATIENT_CLINIC_OR_DEPARTMENT_OTHER)
Admission: RE | Admit: 2023-03-25 | Discharge: 2023-03-25 | Disposition: A | Discharge status: Home / Self Care | Payer: Self-pay | Source: Ambulatory Visit | Attending: Family Medicine | Admitting: Family Medicine

## 2023-03-25 DIAGNOSIS — E119 Type 2 diabetes mellitus without complications: Secondary | ICD-10-CM | POA: Insufficient documentation

## 2023-03-27 ENCOUNTER — Encounter: Payer: Self-pay | Admitting: Family Medicine

## 2023-03-27 DIAGNOSIS — R931 Abnormal findings on diagnostic imaging of heart and coronary circulation: Secondary | ICD-10-CM

## 2023-03-27 NOTE — Telephone Encounter (Signed)
Pt has an appt with dr Perrin Maltese cardiologist at [facility] on 04-23-2023

## 2023-03-27 NOTE — Telephone Encounter (Signed)
Pt is calling and do have an appt with cardiologist and per crm was returning dr burchette call

## 2023-03-27 NOTE — Telephone Encounter (Signed)
Referral has been canceled.

## 2023-03-27 NOTE — Addendum Note (Signed)
Addended by: Christy Sartorius on: 03/27/2023 01:21 PM   Modules accepted: Orders

## 2023-05-20 ENCOUNTER — Other Ambulatory Visit: Payer: Self-pay | Admitting: Family Medicine

## 2023-07-27 ENCOUNTER — Encounter: Payer: Self-pay | Admitting: Family Medicine

## 2023-07-27 MED ORDER — FREESTYLE LIBRE 3 PLUS SENSOR MISC: 2 refills | Status: DC

## 2023-08-16 ENCOUNTER — Other Ambulatory Visit: Payer: Self-pay | Admitting: Family Medicine

## 2023-08-24 ENCOUNTER — Other Ambulatory Visit: Payer: Self-pay | Admitting: Family Medicine

## 2023-09-25 ENCOUNTER — Other Ambulatory Visit: Payer: Self-pay | Admitting: Family Medicine

## 2023-09-27 ENCOUNTER — Encounter: Payer: Self-pay | Admitting: Family Medicine

## 2023-09-28 MED ORDER — FREESTYLE LIBRE 3 PLUS SENSOR MISC
2 refills | Status: AC
  Filled 2024-03-18: qty 2, 30d supply, fill #0
  Filled 2024-04-18 – 2024-04-21 (×2): qty 6, 90d supply, fill #0

## 2023-09-30 ENCOUNTER — Other Ambulatory Visit (HOSPITAL_COMMUNITY): Payer: Self-pay

## 2023-09-30 ENCOUNTER — Telehealth: Payer: Self-pay

## 2023-09-30 NOTE — Telephone Encounter (Signed)
 Pharmacy Patient Advocate Encounter   Received notification from Onbase that prior authorization for Testosterone  Cypionate 200MG /ML intramuscular solution is required/requested.   Insurance verification completed.   The patient is insured through [place] .   Per test claim: PA required; PA submitted to above mentioned insurance via CoverMyMeds Key/confirmation #/EOC [id number] Status is pending

## 2023-09-30 NOTE — Telephone Encounter (Signed)
 Pharmacy Patient Advocate Encounter  Received notification from [facility] [employer]  that Prior Authorization for Testosterone  Cypionate 200MG /ML intramuscular solution has been APPROVED from 09/30/23 to 09/29/24. Ran test claim, Copay is $20. This test claim was processed through [facility]- copay amounts may vary at other pharmacies due to pharmacy/plan contracts, or as the patient moves through the different stages of their insurance plan.   PA #/Case ID/Reference #:  [id number]

## 2023-10-19 ENCOUNTER — Other Ambulatory Visit: Payer: Self-pay | Admitting: Family Medicine

## 2023-11-11 ENCOUNTER — Other Ambulatory Visit: Payer: Self-pay | Admitting: Family Medicine

## 2023-12-07 ENCOUNTER — Other Ambulatory Visit: Payer: Self-pay | Admitting: Family Medicine

## 2023-12-16 ENCOUNTER — Encounter: Payer: Self-pay | Admitting: Family Medicine

## 2023-12-16 DIAGNOSIS — H9190 Unspecified hearing loss, unspecified ear: Secondary | ICD-10-CM

## 2023-12-22 LAB — HM DIABETES EYE EXAM

## 2024-01-24 ENCOUNTER — Other Ambulatory Visit: Payer: Self-pay | Admitting: Family Medicine

## 2024-02-02 ENCOUNTER — Encounter: Payer: Self-pay | Admitting: Family Medicine

## 2024-02-02 ENCOUNTER — Other Ambulatory Visit: Payer: Self-pay | Admitting: Family Medicine

## 2024-02-05 ENCOUNTER — Other Ambulatory Visit (HOSPITAL_COMMUNITY): Payer: Self-pay

## 2024-02-05 ENCOUNTER — Encounter: Payer: Self-pay | Admitting: Family Medicine

## 2024-02-05 ENCOUNTER — Telehealth: Payer: Self-pay

## 2024-02-05 ENCOUNTER — Ambulatory Visit: Payer: PRIVATE HEALTH INSURANCE | Admitting: Family Medicine

## 2024-02-05 VITALS — BP 122/84 | HR 79 | Temp 98.0°F | Ht 71.0 in | Wt 232.0 lb

## 2024-02-05 DIAGNOSIS — E785 Hyperlipidemia, unspecified: Secondary | ICD-10-CM

## 2024-02-05 DIAGNOSIS — I10 Essential (primary) hypertension: Secondary | ICD-10-CM

## 2024-02-05 DIAGNOSIS — Z23 Encounter for immunization: Secondary | ICD-10-CM

## 2024-02-05 DIAGNOSIS — R7989 Other specified abnormal findings of blood chemistry: Secondary | ICD-10-CM

## 2024-02-05 DIAGNOSIS — E1169 Type 2 diabetes mellitus with other specified complication: Secondary | ICD-10-CM

## 2024-02-05 DIAGNOSIS — Z7985 Long-term (current) use of injectable non-insulin antidiabetic drugs: Secondary | ICD-10-CM

## 2024-02-05 DIAGNOSIS — E119 Type 2 diabetes mellitus without complications: Secondary | ICD-10-CM

## 2024-02-05 LAB — CBC WITH DIFFERENTIAL/PLATELET
Basophils Absolute: 0 K/uL (ref 0.0–0.1)
Basophils Relative: 0.7 % (ref 0.0–3.0)
Eosinophils Absolute: 0.1 K/uL (ref 0.0–0.7)
Eosinophils Relative: 2 % (ref 0.0–5.0)
HCT: 48.9 % (ref 39.0–52.0)
Hemoglobin: 16.9 g/dL (ref 13.0–17.0)
Lymphocytes Relative: 24.7 % (ref 12.0–46.0)
Lymphs Abs: 1.4 K/uL (ref 0.7–4.0)
MCHC: 34.6 g/dL (ref 30.0–36.0)
MCV: 95.8 fl (ref 78.0–100.0)
Monocytes Absolute: 0.6 K/uL (ref 0.1–1.0)
Monocytes Relative: 10.7 % (ref 3.0–12.0)
Neutro Abs: 3.4 K/uL (ref 1.4–7.7)
Neutrophils Relative %: 61.9 % (ref 43.0–77.0)
Platelets: 297 K/uL (ref 150.0–400.0)
RBC: 5.11 Mil/uL (ref 4.22–5.81)
RDW: 13.7 % (ref 11.5–15.5)
WBC: 5.5 K/uL (ref 4.0–10.5)

## 2024-02-05 LAB — HEPATIC FUNCTION PANEL
ALT: 32 U/L (ref 0–53)
AST: 22 U/L (ref 0–37)
Albumin: 4.8 g/dL (ref 3.5–5.2)
Alkaline Phosphatase: 61 U/L (ref 39–117)
Bilirubin, Direct: 0.1 mg/dL (ref 0.0–0.3)
Total Bilirubin: 0.6 mg/dL (ref 0.2–1.2)
Total Protein: 7.3 g/dL (ref 6.0–8.3)

## 2024-02-05 LAB — BASIC METABOLIC PANEL WITH GFR
BUN: 21 mg/dL (ref 6–23)
CO2: 26 meq/L (ref 19–32)
Calcium: 9.4 mg/dL (ref 8.4–10.5)
Chloride: 100 meq/L (ref 96–112)
Creatinine, Ser: 1.05 mg/dL (ref 0.40–1.50)
GFR: 77.92 mL/min (ref >60.00)
Glucose, Bld: 96 mg/dL (ref 70–99)
Potassium: 4.6 meq/L (ref 3.5–5.1)
Sodium: 136 meq/L (ref 135–145)

## 2024-02-05 LAB — MICROALBUMIN / CREATININE URINE RATIO
Creatinine,U: 115.6 mg/dL
Microalb Creat Ratio: 9.3 mg/g (ref 0.0–30.0)
Microalb, Ur: 1.1 mg/dL (ref 0.0–1.9)

## 2024-02-05 LAB — LIPID PANEL
Cholesterol: 123 mg/dL (ref 0–200)
HDL: 33.9 mg/dL — ABNORMAL LOW (ref >39.00)
LDL Cholesterol: 10 mg/dL (ref 0–99)
NonHDL: 89.57
Total CHOL/HDL Ratio: 4
Triglycerides: 400 mg/dL — ABNORMAL HIGH (ref 0.0–149.0)
VLDL: 80 mg/dL — ABNORMAL HIGH (ref 0.0–40.0)

## 2024-02-05 LAB — PSA: PSA: 0.63 ng/mL (ref 0.10–4.00)

## 2024-02-05 LAB — HEMOGLOBIN A1C: Hgb A1c MFr Bld: 6.3 % (ref 4.6–6.5)

## 2024-02-05 MED ORDER — REPATHA SURECLICK 140 MG/ML ~~LOC~~ SOAJ
140.0000 mg | SUBCUTANEOUS | 3 refills | Status: AC
  Filled 2024-03-18 – 2024-04-05 (×2): qty 2, 28d supply, fill #0
  Filled 2024-04-18 – 2024-04-19 (×2): qty 6, 84d supply, fill #0

## 2024-02-05 NOTE — Telephone Encounter (Signed)
 Pharmacy Patient Advocate Encounter  Received notification from [facility] that Prior Authorization for Repatha Sureclick 140 has been APPROVED from 02/05/24 to 04/06/38. Ran test claim, Copay is $45.00. This test claim was processed through [facility]- copay amounts may vary at other pharmacies due to pharmacy/plan contracts, or as the patient moves through the different stages of their insurance plan.   PA #/Case ID/Reference #: # [id number]

## 2024-02-05 NOTE — Telephone Encounter (Signed)
 Pharmacy Patient Advocate Encounter   Received notification from Onbase that prior authorization for Repatha Sureclick 140 is required/requested.   Insurance verification completed.   The patient is insured through [employer].   Per test claim: PA required; PA submitted to above mentioned insurance via Latent Key/confirmation #/EOC [id number] Status is pending

## 2024-02-05 NOTE — Telephone Encounter (Signed)
 Spoke with Nathanel at [facility] and informed her of the approval as below.

## 2024-02-05 NOTE — Progress Notes (Signed)
 Established Patient Office Visit  Subjective   Patient ID: [name], male    DOB: November 12, 1965  Age: 57 y.o. MRN: [id number]  Chief Complaint  Patient presents with   Annual Exam    HPI  Past Medical History:  Diagnosis Date   Asthma    Diarrhea    Giardiasis    resolved   Hyperlipidemia    Hypertension    Obstructive sleep apnea    sleep study 11/20/95, no apneas, ?obstructive hypopneas?   Past Surgical History:  Procedure Laterality Date   ENT surgery  1998    reports that he has never smoked. He has never used smokeless tobacco. He reports current alcohol use. He reports that he does not use drugs. family history includes Cancer (age of onset: 6) in his father; Colon cancer in his father; Heart disease (age of onset: 42) in his sister. Allergies  Allergen Reactions   Crestor  [Rosuvastatin  Calcium ] Other (See Comments)    Muscle aches      [name] is seen for routine chronic medical follow-up.  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  He has worked there previously.  He has chronic problems including hypertension, GERD, type 2 diabetes, dyslipidemia, low testosterone , and history of statin intolerance.  Last year he had coronary calcium  score 456.  He had some atypical left shoulder pain and saw cardiologist in [place] and underwent eventual catheterization with no significant obstructive coronary disease.  He was placed on Repatha 140 mg twice monthly every 14 days.  He is asking if we can take that prescription over.  He had some difficulties getting his prescription filled.  Health maintenance reviewed.  Colonoscopy up-to-date.  Needs Prevnar 20 and consents to that.  Flu vaccine already given.  He had eye exam in September  He remains on testosterone  injection 200 mg IM every 10 days.  This regimen seems to be working well for him.  Needs follow-up  labs.   Review of Systems  Constitutional:  Negative for malaise/fatigue.  Eyes:  Negative for blurred vision.  Respiratory:  Negative for shortness of breath.   Cardiovascular:  Negative for chest pain.  Neurological:  Negative for dizziness, weakness and headaches.      Objective:     BP 122/84 (BP Location: Right Arm, Cuff Size: Normal)   Pulse 79   Temp 98 F (36.7 C) (Oral)   Ht 5' 11 (1.803 m)   Wt 232 lb (105.2 kg)   SpO2 96%   BMI 32.36 kg/m  BP Readings from Last 3 Encounters:  02/05/24 122/84  02/04/23 128/80  12/24/22 (!) 150/98   Wt Readings from Last 3 Encounters:  02/05/24 232 lb (105.2 kg)  02/04/23 225 lb 4.8 oz (102.2 kg)  12/24/22 229 lb 4.8 oz (104 kg)      Physical Exam Vitals reviewed.  Constitutional:      General: He is not in acute distress.    Appearance: He is well-developed. He is not ill-appearing.  HENT:     Right Ear: External ear normal.     Left Ear: External ear normal.  Eyes:     Pupils: Pupils are equal, round, and reactive to light.  Neck:     Thyroid : No thyromegaly.  Cardiovascular:     Rate and Rhythm: Normal rate and regular rhythm.  Pulmonary:     Effort: Pulmonary effort is normal. No respiratory distress.  Breath sounds: Normal breath sounds. No wheezing or rales.  Musculoskeletal:     Cervical back: Neck supple.  Skin:    Comments: Feet reveal no skin lesions. Good distal foot pulses. Good capillary refill. No calluses. Normal sensation with monofilament testing   Neurological:     Mental Status: He is alert and oriented to person, place, and time.      No results found for any visits on 02/05/24.    The 10-year ASCVD risk score (Arnett DK, et al., 2019) is: 21.3%    Assessment & Plan:   Problem List Items Addressed This Visit       Unprioritized   Low testosterone    Relevant Orders   CBC with Differential/Platelet   PSA   Type 2 diabetes mellitus without complications (HCC)   Relevant  Orders   Hemoglobin A1c   Microalbumin / creatinine urine ratio   Essential hypertension   Relevant Medications   Evolocumab (REPATHA SURECLICK) 140 MG/ML SOAJ   Other Relevant Orders   Basic metabolic panel with GFR   Other Visit Diagnoses       Need for pneumococcal vaccine    -  Primary   Relevant Orders   Pneumococcal conjugate vaccine 20-valent (Prevnar 20) (Completed)     Hyperlipidemia associated with type 2 diabetes mellitus (HCC)       Relevant Medications   Evolocumab (REPATHA SURECLICK) 140 MG/ML SOAJ   Other Relevant Orders   Lipid panel   Hepatic function panel     Multiple medical problems as above.  Needs follow-up labs and these will be obtained as above.  Consider further titration of Ozempic  if A1c not to goal  Prevnar 20 given  Continue annual diabetic eye exam  We did not obtain testosterone  level as he did his last injection earlier this morning and would like to get trough level  Set up 45-month follow-up.  Return in about 6 months (around 08/04/2024).    Wolm Scarlet, MD

## 2024-02-07 ENCOUNTER — Ambulatory Visit: Payer: Self-pay | Admitting: Family Medicine

## 2024-03-15 ENCOUNTER — Other Ambulatory Visit (HOSPITAL_COMMUNITY): Payer: Self-pay

## 2024-03-16 ENCOUNTER — Other Ambulatory Visit (HOSPITAL_BASED_OUTPATIENT_CLINIC_OR_DEPARTMENT_OTHER): Payer: Self-pay

## 2024-03-16 ENCOUNTER — Other Ambulatory Visit (HOSPITAL_COMMUNITY): Payer: Self-pay

## 2024-03-17 ENCOUNTER — Other Ambulatory Visit (HOSPITAL_COMMUNITY): Payer: Self-pay

## 2024-03-17 ENCOUNTER — Other Ambulatory Visit (HOSPITAL_BASED_OUTPATIENT_CLINIC_OR_DEPARTMENT_OTHER): Payer: Self-pay

## 2024-03-18 ENCOUNTER — Other Ambulatory Visit (HOSPITAL_COMMUNITY): Payer: Self-pay

## 2024-03-18 ENCOUNTER — Other Ambulatory Visit: Payer: Self-pay

## 2024-03-18 ENCOUNTER — Other Ambulatory Visit: Payer: Self-pay | Admitting: Family Medicine

## 2024-03-18 MED ORDER — TADALAFIL 20 MG PO TABS
ORAL_TABLET | ORAL | 11 refills | Status: AC
  Filled 2024-03-18: qty 15, 15d supply, fill #0
  Filled 2024-04-05: qty 15, 30d supply, fill #0
  Filled 2024-04-28: qty 15, 30d supply, fill #1

## 2024-03-18 MED ORDER — COLCHICINE 0.6 MG PO TABS
0.6000 mg | ORAL_TABLET | Freq: Two times a day (BID) | ORAL | 2 refills | Status: AC | PRN
  Filled 2024-03-18: qty 30, 15d supply, fill #0

## 2024-03-18 MED FILL — Fluticasone Propionate Nasal Susp 50 MCG/ACT: NASAL | 30 days supply | Qty: 16 | Fill #0 | Status: CN

## 2024-03-18 MED FILL — Omeprazole Cap Delayed Release 20 MG: ORAL | 30 days supply | Qty: 30 | Fill #0 | Status: CN

## 2024-03-18 MED FILL — Syringe/Needle (Disp) 3 ML 18 x 1-1/2": 30 days supply | Qty: 1 | Fill #0 | Status: CN

## 2024-03-18 MED FILL — Lisinopril Tab 10 MG: ORAL | 30 days supply | Qty: 30 | Fill #0 | Status: CN

## 2024-03-18 MED FILL — Metformin HCl Tab 500 MG: ORAL | 30 days supply | Qty: 90 | Fill #0 | Status: CN

## 2024-03-23 ENCOUNTER — Other Ambulatory Visit: Payer: Self-pay

## 2024-04-05 ENCOUNTER — Other Ambulatory Visit (HOSPITAL_COMMUNITY): Payer: Self-pay

## 2024-04-05 ENCOUNTER — Other Ambulatory Visit: Payer: Self-pay | Admitting: Family Medicine

## 2024-04-05 ENCOUNTER — Other Ambulatory Visit: Payer: Self-pay

## 2024-04-05 MED ORDER — "BD PRECISIONGLIDE NEEDLE 23G X 1-1/2"" MISC"
0 refills | Status: AC
  Filled 2024-04-05: qty 4, 28d supply, fill #0
  Filled 2024-04-19: qty 10, 90d supply, fill #0

## 2024-04-05 MED FILL — Fluticasone Propionate Nasal Susp 50 MCG/ACT: NASAL | 30 days supply | Qty: 16 | Fill #0 | Status: AC

## 2024-04-05 MED FILL — Omeprazole Cap Delayed Release 20 MG: ORAL | 30 days supply | Qty: 30 | Fill #0 | Status: AC

## 2024-04-05 MED FILL — "Syringe/Needle (Disp) 3 ML 18 x 1-1/2""": 28 days supply | Qty: 4 | Fill #0 | Status: AC

## 2024-04-06 ENCOUNTER — Other Ambulatory Visit (HOSPITAL_COMMUNITY): Payer: Self-pay

## 2024-04-18 ENCOUNTER — Other Ambulatory Visit (HOSPITAL_COMMUNITY): Payer: Self-pay

## 2024-04-18 ENCOUNTER — Other Ambulatory Visit: Payer: Self-pay

## 2024-04-19 ENCOUNTER — Other Ambulatory Visit: Payer: Self-pay

## 2024-04-19 ENCOUNTER — Telehealth (HOSPITAL_COMMUNITY): Payer: Self-pay | Admitting: Pharmacy Technician

## 2024-04-19 ENCOUNTER — Other Ambulatory Visit (HOSPITAL_COMMUNITY): Payer: Self-pay

## 2024-04-19 ENCOUNTER — Encounter: Payer: Self-pay | Admitting: Family Medicine

## 2024-04-19 NOTE — Telephone Encounter (Signed)
 Pharmacy Patient Advocate Encounter   Received notification from Pt Calls Messages/Pharmacy that prior authorization for Repatha  Sureclicks is required/requested.   Insurance verification completed.   The patient is insured through [facility].   Per test claim: TRIAL OF GENERIC STATIN IN THE PREVIOUS 120 DAYS- (Pt has contraindication to Statins) PA required; PA submitted to above mentioned insurance via Latent Key/confirmation #/EOC [id number] Status is pending

## 2024-04-19 NOTE — Telephone Encounter (Signed)
 PA request has been Submitted. New Encounter has been or will be created for follow up. For additional info see Pharmacy Prior Auth telephone encounter from 04/19/24.

## 2024-04-20 ENCOUNTER — Encounter: Payer: Self-pay | Admitting: Pharmacist

## 2024-04-20 ENCOUNTER — Other Ambulatory Visit: Payer: Self-pay

## 2024-04-21 ENCOUNTER — Encounter: Payer: Self-pay | Admitting: Pharmacist

## 2024-04-21 ENCOUNTER — Other Ambulatory Visit: Payer: Self-pay

## 2024-04-21 ENCOUNTER — Other Ambulatory Visit (HOSPITAL_COMMUNITY): Payer: Self-pay

## 2024-04-21 MED ORDER — OZEMPIC (0.25 OR 0.5 MG/DOSE) 2 MG/3ML ~~LOC~~ SOPN
PEN_INJECTOR | SUBCUTANEOUS | 0 refills | Status: AC
  Filled 2024-04-21 (×2): qty 3, 28d supply, fill #0

## 2024-04-21 MED ORDER — TESTOSTERONE CYPIONATE 200 MG/ML IM SOLN
200.0000 mg | INTRAMUSCULAR | 1 refills | Status: AC
  Filled 2024-04-21: qty 10, 90d supply, fill #0
  Filled 2024-04-26: qty 9, 90d supply, fill #0

## 2024-04-21 NOTE — Telephone Encounter (Signed)
 Pharmacy Patient Advocate Encounter  Received notification from [facility] that Prior Authorization for Repatha  SureCLick 140mg /mL has been APPROVED from 04/19/24 to 04/18/25. Ran test claim, Copay is $104.97. This test claim was processed through [facility]- copay amounts may vary at other pharmacies due to pharmacy/plan contracts, or as the patient moves through the different stages of their insurance plan. I believe there is a repatha  copay card the pt can get if he doesn't already have one.   PA #/Case ID/Reference #: [id number]

## 2024-04-22 ENCOUNTER — Other Ambulatory Visit: Payer: Self-pay

## 2024-04-22 ENCOUNTER — Telehealth (HOSPITAL_COMMUNITY): Payer: Self-pay

## 2024-04-22 ENCOUNTER — Other Ambulatory Visit (HOSPITAL_COMMUNITY): Payer: Self-pay

## 2024-04-22 NOTE — Telephone Encounter (Signed)
 Pharmacy Patient Advocate Encounter   Received notification from Pt Calls Messages that prior authorization for Testosterone  Cypionate 200MG /ML intramuscular solution  is required/requested.   Insurance verification completed.   The patient is insured through [facility].   Per test claim: PA required; PA submitted to above mentioned insurance via Latent Key/confirmation #/EOC [id number] Status is pending

## 2024-04-22 NOTE — Telephone Encounter (Signed)
 PA request has been Received. New Encounter has been or will be created for follow up. For additional info see Pharmacy Prior Auth telephone encounter from 04/22/24.

## 2024-04-26 ENCOUNTER — Other Ambulatory Visit (HOSPITAL_COMMUNITY): Payer: Self-pay

## 2024-04-26 ENCOUNTER — Other Ambulatory Visit: Payer: Self-pay

## 2024-04-26 ENCOUNTER — Other Ambulatory Visit: Payer: Self-pay | Admitting: Family Medicine

## 2024-04-26 MED ORDER — "BD LUER-LOK SYRINGE 18G X 1-1/2"" 3 ML MISC"
0 refills | Status: AC
  Filled 2024-04-26: qty 10, 100d supply, fill #0

## 2024-04-26 MED FILL — Lisinopril Tab 10 MG: ORAL | 90 days supply | Qty: 90 | Fill #0 | Status: AC

## 2024-04-26 MED FILL — Metformin HCl Tab 500 MG: ORAL | 90 days supply | Qty: 270 | Fill #0 | Status: AC

## 2024-04-26 NOTE — Telephone Encounter (Signed)
 Pharmacy Patient Advocate Encounter  Received notification from [facility] that Prior Authorization for Testosterone  Cypionate 200MG /ML intramuscular solution has been DENIED.  Full denial letter will be uploaded to the media tab. See denial reason below.    PA #/Case ID/Reference #: Denial letter has been uploaded to pt's media tab

## 2024-04-27 NOTE — Telephone Encounter (Signed)
 I will route to the appeals team

## 2024-04-28 ENCOUNTER — Other Ambulatory Visit (HOSPITAL_COMMUNITY): Payer: Self-pay

## 2024-04-28 ENCOUNTER — Other Ambulatory Visit: Payer: Self-pay | Admitting: Family Medicine

## 2024-04-28 ENCOUNTER — Other Ambulatory Visit: Payer: Self-pay

## 2024-04-28 ENCOUNTER — Telehealth: Payer: Self-pay | Admitting: Pharmacist

## 2024-04-28 MED FILL — Fluticasone Propionate Nasal Susp 50 MCG/ACT: NASAL | 30 days supply | Qty: 16 | Fill #1 | Status: CN

## 2024-04-28 NOTE — Telephone Encounter (Signed)
 The appeal for Testosterone  Cypionate 200MG /ML intramuscular solution has been approved by the insurance through 04/27/2025.  Thank you, Devere Pandy, PharmD Clinical Pharmacist  [place]  Direct Dial: [phone]

## 2024-04-29 ENCOUNTER — Encounter (HOSPITAL_COMMUNITY): Payer: Self-pay

## 2024-04-29 ENCOUNTER — Other Ambulatory Visit: Payer: Self-pay

## 2024-04-29 ENCOUNTER — Other Ambulatory Visit (HOSPITAL_COMMUNITY): Payer: Self-pay

## 2024-04-29 MED ORDER — FLUTICASONE PROPIONATE 50 MCG/ACT NA SUSP
NASAL | 1 refills | Status: AC
  Filled 2024-04-29: qty 48, 90d supply, fill #0

## 2024-05-02 ENCOUNTER — Other Ambulatory Visit (HOSPITAL_COMMUNITY): Payer: Self-pay

## 2024-07-11 ENCOUNTER — Ambulatory Visit: Payer: PRIVATE HEALTH INSURANCE | Admitting: Cardiovascular Disease
